# Patient Record
Sex: Female | Born: 1990 | Race: White | Hispanic: No | Marital: Married | Smoking: Former smoker
Health system: Southern US, Community
[De-identification: ages and names within clinical notes are randomized; demographics above are authoritative.]

## PROBLEM LIST (undated history)

## (undated) DIAGNOSIS — J45909 Unspecified asthma, uncomplicated: Secondary | ICD-10-CM

## (undated) DIAGNOSIS — F419 Anxiety disorder, unspecified: Secondary | ICD-10-CM

## (undated) HISTORY — DX: Unspecified asthma, uncomplicated: J45.909

## (undated) HISTORY — DX: Anxiety disorder, unspecified: F41.9

## (undated) HISTORY — PX: WISDOM TOOTH EXTRACTION: SHX21

---
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
## 2012-02-11 ENCOUNTER — Emergency Department (HOSPITAL_COMMUNITY)
Admission: EM | Admit: 2012-02-11 | Discharge: 2012-02-11 | Disposition: A | Discharge status: Home / Self Care | Payer: Self-pay | Attending: Emergency Medicine | Admitting: Emergency Medicine

## 2012-02-11 ENCOUNTER — Encounter (HOSPITAL_COMMUNITY): Payer: Self-pay | Admitting: *Deleted

## 2012-02-11 DIAGNOSIS — J9801 Acute bronchospasm: Secondary | ICD-10-CM

## 2012-02-11 DIAGNOSIS — F17200 Nicotine dependence, unspecified, uncomplicated: Secondary | ICD-10-CM | POA: Insufficient documentation

## 2012-02-11 DIAGNOSIS — Z79899 Other long term (current) drug therapy: Secondary | ICD-10-CM | POA: Insufficient documentation

## 2012-02-11 DIAGNOSIS — R059 Cough, unspecified: Secondary | ICD-10-CM | POA: Insufficient documentation

## 2012-02-11 DIAGNOSIS — J45901 Unspecified asthma with (acute) exacerbation: Secondary | ICD-10-CM | POA: Insufficient documentation

## 2012-02-11 DIAGNOSIS — R05 Cough: Secondary | ICD-10-CM | POA: Insufficient documentation

## 2012-02-11 MED ORDER — IPRATROPIUM BROMIDE 0.02 % IN SOLN
0.5000 mg | Freq: Once | RESPIRATORY_TRACT | Status: AC
  Administered 2012-02-11: 0.5 mg via RESPIRATORY_TRACT
  Filled 2012-02-11: qty 2.5

## 2012-02-11 MED ORDER — ALBUTEROL SULFATE HFA 108 (90 BASE) MCG/ACT IN AERS
2.0000 | INHALATION_SPRAY | RESPIRATORY_TRACT | Status: DC | PRN
  Filled 2012-02-11: qty 6.7

## 2012-02-11 MED ORDER — ALBUTEROL SULFATE (5 MG/ML) 0.5% IN NEBU
5.0000 mg | INHALATION_SOLUTION | Freq: Once | RESPIRATORY_TRACT | Status: AC
  Administered 2012-02-11: 5 mg via RESPIRATORY_TRACT
  Filled 2012-02-11: qty 1

## 2012-02-11 MED ORDER — ALBUTEROL SULFATE (2.5 MG/3ML) 0.083% IN NEBU: 2.5000 mg | INHALATION_SOLUTION | RESPIRATORY_TRACT | Status: DC | PRN

## 2012-02-11 NOTE — ED Notes (Signed)
Pt states she woke this morning coughing & wheezing. Pt states tried to use what little nebulizer medication she had w/ no relief. Pt does not have emergency inhaler, can not afford.

## 2012-02-11 NOTE — ED Notes (Signed)
Pt reports she woke up coughing and wheezing.  Reports cough non-productive.  States that she did have a small amount of nebulizer fluid that she used with no relief.

## 2012-02-11 NOTE — ED Provider Notes (Signed)
History   This chart was scribed for Brenda Lennert, MD, by Frederik Pear, ER scribe. The patient was seen in room APA04/[facility]04 and the patient's care was started at 0718.    CSN: [id number]  Arrival date & time 02/11/12  4034   First MD Initiated Contact with Patient 02/11/12 [phone]      Chief Complaint  Patient presents with  . Shortness of Breath    (Consider location/radiation/quality/duration/timing/severity/associated sxs/prior treatment) Patient is a 22 y.o. female presenting with shortness of breath. The history is provided by the patient. No language interpreter was used.  Shortness of Breath  The current episode started today. The problem occurs continuously. The problem has been unchanged. The problem is moderate. Nothing relieves the symptoms. The symptoms are aggravated by activity. Associated symptoms include shortness of breath. Pertinent negatives include no chest pain and no cough.   Brenda Bauer is a 22 y.o. female with a h/o of asthma that is aggravated by cold weather and exertional activities who presents to the Emergency Department complaining of constant, moderate, sudden onset SOB with associated non-productive coughing and wheezing that began this morning. She denies associated fever or chills. She reports that she had 1 nebulizer vial at home that she used without relief. She reports that she does not currently have a job so she is unable to afford refills of either the inhalers or the nebulizer medications.  Past Medical History  Diagnosis Date  . Asthma     History reviewed. No pertinent past surgical history.  History reviewed. No pertinent family history.  History  Substance Use Topics  . Smoking status: Current Every Day Smoker  . Smokeless tobacco: Not on file  . Alcohol Use: No    OB History    Grav Para Term Preterm Abortions TAB SAB Ect Mult Living                  Review of Systems  Constitutional: Negative for fatigue.  HENT:  Negative for congestion, sinus pressure and ear discharge.   Eyes: Negative for discharge.  Respiratory: Positive for shortness of breath. Negative for cough.   Cardiovascular: Negative for chest pain.  Gastrointestinal: Negative for abdominal pain and diarrhea.  Genitourinary: Negative for frequency and hematuria.  Musculoskeletal: Negative for back pain.  Skin: Negative for rash.  Neurological: Negative for seizures and headaches.  Hematological: Negative.   Psychiatric/Behavioral: Negative for hallucinations.    Allergies  Review of patient's allergies indicates no known allergies.  Home Medications   Current Outpatient Rx  Name  Route  Sig  Dispense  Refill  . ALBUTEROL SULFATE HFA 108 (90 BASE) MCG/ACT IN AERS   Inhalation   Inhale 2 puffs into the lungs every 2 (two) hours as needed for wheezing or shortness of breath (cough).   1 Inhaler   12   . ALBUTEROL SULFATE (5 MG/ML) 0.5% IN NEBU   Nebulization   Take 5 mg by nebulization every 6 (six) hours as needed for wheezing.   20 mL   0   . TUSSIN CF PO   Oral   Take 10 mLs by mouth every 4 (four) hours as needed. FOR COUGH         . PREDNISONE 50 MG PO TABS   Oral   Take 1 tablet (50 mg total) by mouth daily.   7 tablet   1     BP 98/55  Pulse 97  Temp 98.1 F (36.7 C) (Oral)  Resp  20  Ht 5\' 7"  (1.702 m)  Wt 230 lb (104.327 kg)  BMI 36.02 kg/m2  SpO2 96%  Physical Exam  Constitutional: She is oriented to person, place, and time. She appears well-developed.  HENT:  Head: Normocephalic and atraumatic.  Eyes: Conjunctivae normal and EOM are normal. No scleral icterus.  Neck: Neck supple. No thyromegaly present.  Cardiovascular: Normal rate and regular rhythm.  Exam reveals no gallop and no friction rub.   No murmur heard. Pulmonary/Chest: No stridor. She has no wheezes. She has no rales. She exhibits no tenderness.  Abdominal: She exhibits no distension. There is no tenderness. There is no rebound.    Musculoskeletal: Normal range of motion. She exhibits no edema.  Lymphadenopathy:    She has no cervical adenopathy.  Neurological: She is oriented to person, place, and time. Coordination normal.  Skin: No rash noted. No erythema.  Psychiatric: She has a normal mood and affect. Her behavior is normal.    ED Course  Procedures (including critical care time)  DIAGNOSTIC STUDIES: Oxygen Saturation is 96% on room air, adequate by my interpretation.    COORDINATION OF CARE:  07:24- Discussed planned course of treatment with the patient, including discharge, who is agreeable at this time.   Labs Reviewed - No data to display No results found.   No diagnosis found.    MDM   The chart was scribed for me under my direct supervision.  I personally performed the history, physical, and medical decision making and all procedures in the evaluation of this patient.Brenda Lennert, MD 02/11/12 [phone]

## 2012-02-11 NOTE — Discharge Instructions (Signed)
Follow up as needed

## 2012-07-07 ENCOUNTER — Encounter (HOSPITAL_COMMUNITY): Payer: Self-pay | Admitting: *Deleted

## 2012-07-07 ENCOUNTER — Emergency Department (HOSPITAL_COMMUNITY)
Admission: EM | Admit: 2012-07-07 | Discharge: 2012-07-07 | Disposition: A | Discharge status: Home / Self Care | Payer: Self-pay | Attending: Emergency Medicine | Admitting: Emergency Medicine

## 2012-07-07 DIAGNOSIS — R509 Fever, unspecified: Secondary | ICD-10-CM | POA: Insufficient documentation

## 2012-07-07 DIAGNOSIS — J029 Acute pharyngitis, unspecified: Secondary | ICD-10-CM | POA: Insufficient documentation

## 2012-07-07 DIAGNOSIS — J069 Acute upper respiratory infection, unspecified: Secondary | ICD-10-CM | POA: Insufficient documentation

## 2012-07-07 DIAGNOSIS — R1110 Vomiting, unspecified: Secondary | ICD-10-CM | POA: Insufficient documentation

## 2012-07-07 DIAGNOSIS — R059 Cough, unspecified: Secondary | ICD-10-CM | POA: Insufficient documentation

## 2012-07-07 DIAGNOSIS — Z79899 Other long term (current) drug therapy: Secondary | ICD-10-CM | POA: Insufficient documentation

## 2012-07-07 DIAGNOSIS — R0789 Other chest pain: Secondary | ICD-10-CM | POA: Insufficient documentation

## 2012-07-07 DIAGNOSIS — J45901 Unspecified asthma with (acute) exacerbation: Secondary | ICD-10-CM | POA: Insufficient documentation

## 2012-07-07 DIAGNOSIS — Z87891 Personal history of nicotine dependence: Secondary | ICD-10-CM | POA: Insufficient documentation

## 2012-07-07 DIAGNOSIS — J3489 Other specified disorders of nose and nasal sinuses: Secondary | ICD-10-CM | POA: Insufficient documentation

## 2012-07-07 DIAGNOSIS — R05 Cough: Secondary | ICD-10-CM | POA: Insufficient documentation

## 2012-07-07 DIAGNOSIS — J4521 Mild intermittent asthma with (acute) exacerbation: Secondary | ICD-10-CM

## 2012-07-07 LAB — RAPID STREP SCREEN (MED CTR MEBANE ONLY): Streptococcus, Group A Screen (Direct): NEGATIVE

## 2012-07-07 MED ORDER — PREDNISONE 10 MG PO TABS: ORAL_TABLET | ORAL | Status: DC

## 2012-07-07 MED ORDER — ALBUTEROL SULFATE 0.63 MG/3ML IN NEBU: 1.0000 | INHALATION_SOLUTION | Freq: Four times a day (QID) | RESPIRATORY_TRACT | Status: DC | PRN

## 2012-07-07 MED ORDER — AZITHROMYCIN 250 MG PO TABS: ORAL_TABLET | ORAL | Status: DC

## 2012-07-07 MED ORDER — ACETAMINOPHEN 500 MG PO TABS
1000.0000 mg | ORAL_TABLET | Freq: Once | ORAL | Status: AC
  Administered 2012-07-07: 1000 mg via ORAL
  Filled 2012-07-07: qty 2

## 2012-07-07 NOTE — ED Notes (Signed)
C/o sore throat, dry cough and nasal congestion drainage x 5 days

## 2012-07-07 NOTE — ED Provider Notes (Signed)
History     CSN: [id number]  Arrival date & time 07/07/12  1757   First MD Initiated Contact with Patient 07/07/12 1838      Chief Complaint  Patient presents with  . Sore Throat  . Fever  . Cough    (Consider location/radiation/quality/duration/timing/severity/associated sxs/prior treatment) Patient is a 22 y.o. female presenting with cough. The history is provided by the patient.  Cough Cough characteristics:  Productive Sputum characteristics:  Nondescript Severity:  Moderate Onset quality:  Gradual Duration:  5 days Timing:  Intermittent Progression:  Unchanged Chronicity:  New Smoker: no   Context: upper respiratory infection   Relieved by:  Nothing Worsened by:  Nothing tried Ineffective treatments:  Beta-agonist inhaler Associated symptoms: fever, rhinorrhea, shortness of breath, sore throat and wheezing   Associated symptoms: no chest pain, no chills, no ear pain, no headaches, no myalgias and no rash   Associated symptoms comment:  Chest tightness and occasional post-tussive emesis Sore throat:    Severity:  Mild   Onset quality:  Gradual   Duration:  5 days   Timing:  Constant   Progression:  Unchanged Risk factors comment:  Hx of asthma   Past Medical History  Diagnosis Date  . Asthma     History reviewed. No pertinent past surgical history.  No family history on file.  History  Substance Use Topics  . Smoking status: Former Smoker    Types: Cigarettes  . Smokeless tobacco: Not on file  . Alcohol Use: No    OB History   Grav Para Term Preterm Abortions TAB SAB Ect Mult Living                  Review of Systems  Constitutional: Positive for fever. Negative for chills, activity change and appetite change.  HENT: Positive for congestion, sore throat and rhinorrhea. Negative for ear pain, facial swelling, trouble swallowing, neck pain, neck stiffness and voice change.   Eyes: Negative for visual disturbance.  Respiratory: Positive for  cough, chest tightness, shortness of breath and wheezing. Negative for stridor.   Cardiovascular: Negative for chest pain.  Gastrointestinal: Negative for nausea, vomiting and abdominal pain.  Genitourinary: Negative for dysuria, flank pain and pelvic pain.  Musculoskeletal: Negative for myalgias.  Skin: Negative.  Negative for rash.  Neurological: Negative for dizziness, weakness, numbness and headaches.  Hematological: Negative for adenopathy.  Psychiatric/Behavioral: Negative for confusion.  All other systems reviewed and are negative.    Allergies  Review of patient's allergies indicates no known allergies.  Home Medications   Current Outpatient Rx  Name  Route  Sig  Dispense  Refill  . albuterol (PROVENTIL HFA;VENTOLIN HFA) 108 (90 BASE) MCG/ACT inhaler   Inhalation   Inhale 2 puffs into the lungs every 2 (two) hours as needed for wheezing or shortness of breath (cough).   1 Inhaler   12   . albuterol (PROVENTIL) (2.5 MG/3ML) 0.083% nebulizer solution   Nebulization   Take 3 mLs (2.5 mg total) by nebulization every 4 (four) hours as needed for wheezing.   30 vial   0   . EXPIRED: albuterol (PROVENTIL) (5 MG/ML) 0.5% nebulizer solution   Nebulization   Take 5 mg by nebulization every 6 (six) hours as needed for wheezing.   20 mL   0   . Phenylephrine-DM-GG (TUSSIN CF PO)   Oral   Take 10 mLs by mouth every 4 (four) hours as needed. FOR COUGH         .  predniSONE (DELTASONE) 50 MG tablet   Oral   Take 1 tablet (50 mg total) by mouth daily.   7 tablet   1     BP 138/77  Pulse 99  Temp(Src) 100 F (37.8 C) (Oral)  Resp 20  Ht 5\' 7"  (1.702 m)  Wt 234 lb (106.142 kg)  BMI 36.64 kg/m2  SpO2 99%  Physical Exam  Nursing note and vitals reviewed. Constitutional: She is oriented to person, place, and time. She appears well-developed and well-nourished. No distress.  HENT:  Head: Normocephalic and atraumatic. No trismus in the jaw.  Right Ear: Tympanic  membrane and ear canal normal.  Left Ear: Tympanic membrane and ear canal normal.  Nose: Mucosal edema present.  Mouth/Throat: Uvula is midline and mucous membranes are normal. Oral lesions present. No edematous. Posterior oropharyngeal erythema present. No oropharyngeal exudate or tonsillar abscesses.  Eyes: EOM are normal. Pupils are equal, round, and reactive to light.  Neck: Normal range of motion. Neck supple.  Cardiovascular: Normal rate, regular rhythm, normal heart sounds and intact distal pulses.   No murmur heard. Pulmonary/Chest: Effort normal. No respiratory distress. She has wheezes. She has no rales. She exhibits no tenderness.  Coarse lungs sounds bilaterally that improve after coughing.  Few scattered expiratory wheezes.  No rales   Abdominal: Soft. She exhibits no distension. There is no tenderness. There is no rebound and no guarding.  Musculoskeletal: She exhibits no edema.  Lymphadenopathy:    She has no cervical adenopathy.  Neurological: She is alert and oriented to person, place, and time. She exhibits normal muscle tone. Coordination normal.  Skin: Skin is warm and dry.    ED Course  Procedures (including critical care time)  Labs Reviewed  RAPID STREP SCREEN   Results for orders placed during the hospital encounter of 07/07/12  RAPID STREP SCREEN      Result Value Range   Streptococcus, Group A Screen (Direct) NEGATIVE  NEGATIVE        MDM       1920  Rapid strep screen still pending, lab was consulted and specimen had not been started yet.  Told that results would be another 30 minutes.     1955  Patient is feeling better after tylenol.  Fever improved.  PERC negative, pt is non-toxic appearing.  No rales or respiratory distress noted on exam.  Pt has been using inhaler but requests refill for her albuterol vials for her nebulizer.  Will also treat with steroids and z-pack.  She agrees to tylenol/ ibuprofen for fever, rest, fluids and return here if  not improving.  She appears stable for discharge.    Ketrick Matney L. Thaison Kolodziejski, PA-C 07/08/12 1240

## 2012-07-07 NOTE — ED Notes (Signed)
Instructions, prescriptions and f/u information given/reviewed - verbalizes understanding.  

## 2012-07-10 LAB — CULTURE, GROUP A STREP

## 2012-07-10 NOTE — ED Provider Notes (Signed)
Medical screening examination/treatment/procedure(s) were performed by non-physician practitioner and as supervising physician I was immediately available for consultation/collaboration.    Vida Roller, MD 07/10/12 [phone]

## 2012-08-12 ENCOUNTER — Emergency Department (HOSPITAL_COMMUNITY)
Admission: EM | Admit: 2012-08-12 | Discharge: 2012-08-12 | Discharge status: Against Medical Advice | Payer: Self-pay | Attending: Emergency Medicine | Admitting: Emergency Medicine

## 2012-08-12 ENCOUNTER — Encounter (HOSPITAL_COMMUNITY): Payer: Self-pay | Admitting: *Deleted

## 2012-08-12 DIAGNOSIS — Z3202 Encounter for pregnancy test, result negative: Secondary | ICD-10-CM | POA: Insufficient documentation

## 2012-08-12 DIAGNOSIS — J45909 Unspecified asthma, uncomplicated: Secondary | ICD-10-CM | POA: Insufficient documentation

## 2012-08-12 DIAGNOSIS — R109 Unspecified abdominal pain: Secondary | ICD-10-CM | POA: Insufficient documentation

## 2012-08-12 DIAGNOSIS — Z87891 Personal history of nicotine dependence: Secondary | ICD-10-CM | POA: Insufficient documentation

## 2012-08-12 LAB — URINE MICROSCOPIC-ADD ON

## 2012-08-12 LAB — URINALYSIS, ROUTINE W REFLEX MICROSCOPIC
Bilirubin Urine: NEGATIVE
Glucose, UA: 100 mg/dL — AB
Ketones, ur: NEGATIVE mg/dL
Leukocytes, UA: NEGATIVE
Nitrite: NEGATIVE
Protein, ur: NEGATIVE mg/dL
Specific Gravity, Urine: >1.03 — ABNORMAL HIGH (ref 1.005–1.030)
Urobilinogen, UA: 0.2 mg/dL (ref 0.0–1.0)
pH: 5.5 (ref 5.0–8.0)

## 2012-08-12 LAB — PREGNANCY, URINE: Preg Test, Ur: NEGATIVE

## 2012-08-12 NOTE — ED Notes (Signed)
Pt c/o lower abdominal pain. Pt states  "it feels like menstrual cramps." Pt states she had her period 2 weeks ago.

## 2012-12-25 ENCOUNTER — Emergency Department (HOSPITAL_COMMUNITY)
Admission: EM | Admit: 2012-12-25 | Discharge: 2012-12-25 | Disposition: A | Discharge status: Home / Self Care | Payer: Medicaid Other | Attending: Emergency Medicine | Admitting: Emergency Medicine

## 2012-12-25 ENCOUNTER — Encounter (HOSPITAL_COMMUNITY): Payer: Self-pay | Admitting: Emergency Medicine

## 2012-12-25 DIAGNOSIS — J45901 Unspecified asthma with (acute) exacerbation: Secondary | ICD-10-CM | POA: Insufficient documentation

## 2012-12-25 DIAGNOSIS — Z87891 Personal history of nicotine dependence: Secondary | ICD-10-CM | POA: Insufficient documentation

## 2012-12-25 DIAGNOSIS — R000 Tachycardia, unspecified: Secondary | ICD-10-CM | POA: Insufficient documentation

## 2012-12-25 MED ORDER — IPRATROPIUM BROMIDE 0.02 % IN SOLN
0.5000 mg | Freq: Once | RESPIRATORY_TRACT | Status: AC
  Administered 2012-12-25: 0.5 mg via RESPIRATORY_TRACT
  Filled 2012-12-25: qty 2.5

## 2012-12-25 MED ORDER — ALBUTEROL SULFATE (5 MG/ML) 0.5% IN NEBU
5.0000 mg | INHALATION_SOLUTION | Freq: Once | RESPIRATORY_TRACT | Status: AC
  Administered 2012-12-25: 5 mg via RESPIRATORY_TRACT
  Filled 2012-12-25: qty 1

## 2012-12-25 MED ORDER — PREDNISONE 20 MG PO TABS: 60.0000 mg | ORAL_TABLET | Freq: Every day | ORAL | Status: DC

## 2012-12-25 MED ORDER — PREDNISONE 50 MG PO TABS
60.0000 mg | ORAL_TABLET | Freq: Once | ORAL | Status: AC
  Administered 2012-12-25: 60 mg via ORAL
  Filled 2012-12-25 (×2): qty 1

## 2012-12-25 NOTE — ED Provider Notes (Signed)
CSN: [id number]     Arrival date & time 12/25/12  1535 History   First MD Initiated Contact with Patient 12/25/12 1608     Chief Complaint  Patient presents with  . Asthma   (Consider location/radiation/quality/duration/timing/severity/associated sxs/prior Treatment) Patient is a 22 y.o. female presenting with asthma. The history is provided by the patient.  Asthma This is a recurrent (Patient describes yearly increased flares of her Orson Eva which is triggered by cold weather.) problem. The current episode started yesterday. The problem occurs intermittently. The problem has been unchanged. Associated symptoms include chest pain and coughing. Pertinent negatives include no abdominal pain, arthralgias, chills, congestion, diaphoresis, fatigue, fever, headaches, joint swelling, myalgias, nausea, neck pain, numbness, rash, sore throat, swollen glands or weakness. The symptoms are aggravated by exertion. Treatments tried: albuterol mdi - taking q 2 hours without relief. The treatment provided no relief.    Past Medical History  Diagnosis Date  . Asthma    History reviewed. No pertinent past surgical history. No family history on file. History  Substance Use Topics  . Smoking status: Former Smoker    Types: Cigarettes  . Smokeless tobacco: Not on file  . Alcohol Use: No   OB History   Grav Para Term Preterm Abortions TAB SAB Ect Mult Living                 Review of Systems  Constitutional: Negative for fever, chills, diaphoresis and fatigue.  HENT: Negative for congestion and sore throat.   Eyes: Negative.   Respiratory: Positive for cough, shortness of breath and wheezing. Negative for chest tightness and stridor.   Cardiovascular: Positive for chest pain.       She reports precordial soreness from coughing.  Gastrointestinal: Negative for nausea and abdominal pain.  Genitourinary: Negative.   Musculoskeletal: Negative for arthralgias, joint swelling, myalgias and neck pain.   Skin: Negative.  Negative for rash and wound.  Neurological: Negative for dizziness, weakness, light-headedness, numbness and headaches.  Psychiatric/Behavioral: Negative.     Allergies  Review of patient's allergies indicates no known allergies.  Home Medications   Current Outpatient Rx  Name  Route  Sig  Dispense  Refill  . predniSONE (DELTASONE) 20 MG tablet   Oral   Take 3 tablets (60 mg total) by mouth daily.   12 tablet   0    BP 126/84  Pulse 118  Temp(Src) 98.1 F (36.7 C) (Oral)  Resp 18  Ht 5\' 7"  (1.702 m)  Wt 220 lb (99.791 kg)  BMI 34.45 kg/m2  SpO2 96%  LMP 11/30/2012 Physical Exam  Nursing note and vitals reviewed. Constitutional: She appears well-developed and well-nourished.  HENT:  Head: Normocephalic and atraumatic.  Eyes: Conjunctivae are normal.  Neck: Normal range of motion.  Cardiovascular: Regular rhythm, normal heart sounds and intact distal pulses.  Tachycardia present.   Pulmonary/Chest: Effort normal. She has wheezes in the right lower field and the left lower field.  Examined post albuterol and atrovent neb tx.  Trace expiratory wheeze bilateral bases.  Abdominal: Soft. Bowel sounds are normal. There is no tenderness.  Musculoskeletal: Normal range of motion.  Neurological: She is alert.  Skin: Skin is warm and dry.  Psychiatric: She has a normal mood and affect.    ED Course  Procedures (including critical care time) Labs Review Labs Reviewed - No data to display Imaging Review No results found.  EKG Interpretation   None       MDM   1.  Asthma exacerbation    Pt felt much improved after albuterol and atrovent neb tx.  Prednisone pulse dose 60 mg given prior to discharge.  She was also provided a spacer for her mdi which she does not have.  She is scheduled f/u with pcp next,  Encouraged recheck sooner here or by pcp for any worsened sx.  Pt with dry cough, no fevers, no exam findings suggesting pulmonary infection.  She  was tachycardic today but had just used her mdi prior to arrival here.    Burgess Amor, PA-C 12/25/12 1640

## 2012-12-25 NOTE — ED Notes (Signed)
Pt states cough began last night. Hx of asthma. States inhaler is not helping. NAD.

## 2013-01-02 NOTE — ED Provider Notes (Signed)
Medical screening examination/treatment/procedure(s) were performed by non-physician practitioner and as supervising physician I was immediately available for consultation/collaboration.  EKG Interpretation   None         Roney Marion, MD 01/02/13 2312

## 2013-02-28 IMAGING — CR DG CHEST 2V
2 series · 2 of 2 positions shown · non-contrast
Comparison: 03/30/2011; 03/01/2010

CLINICAL DATA: Asthma

CHEST - 2 VIEW

[view not recorded (1 of 2)]
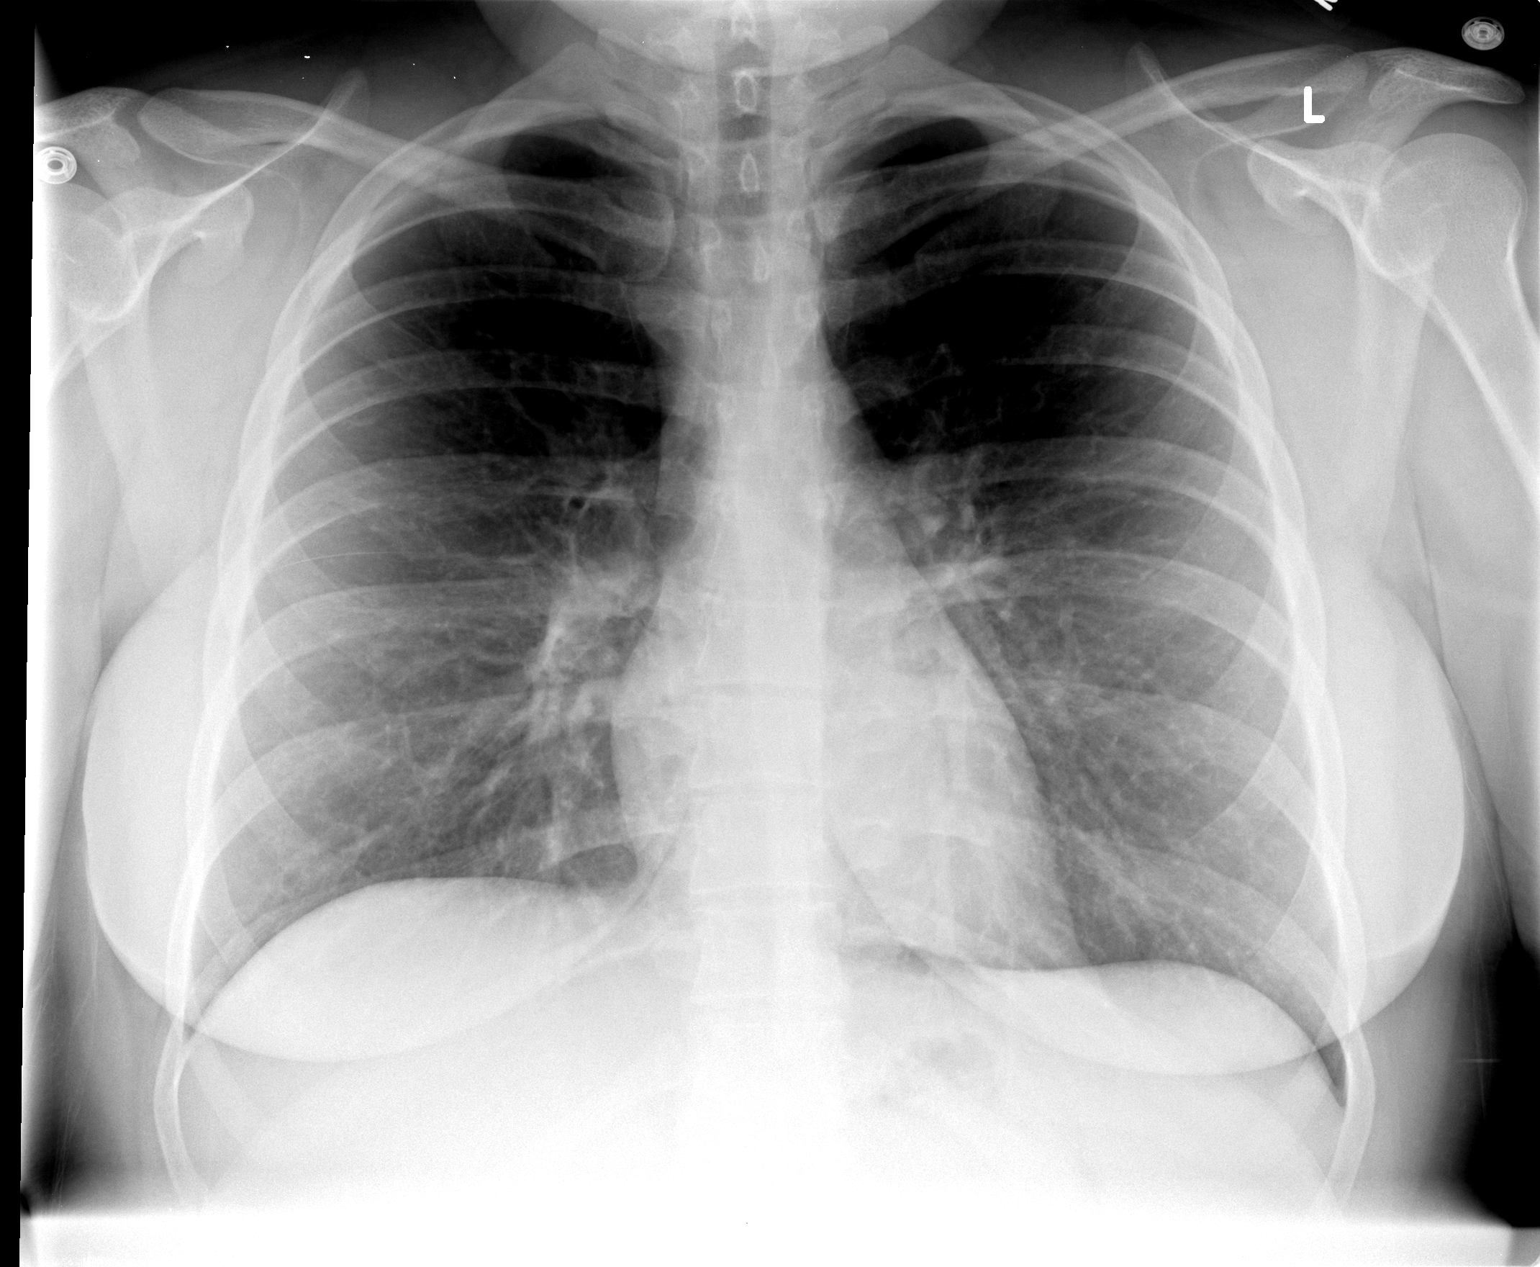

[view not recorded (2 of 2)]
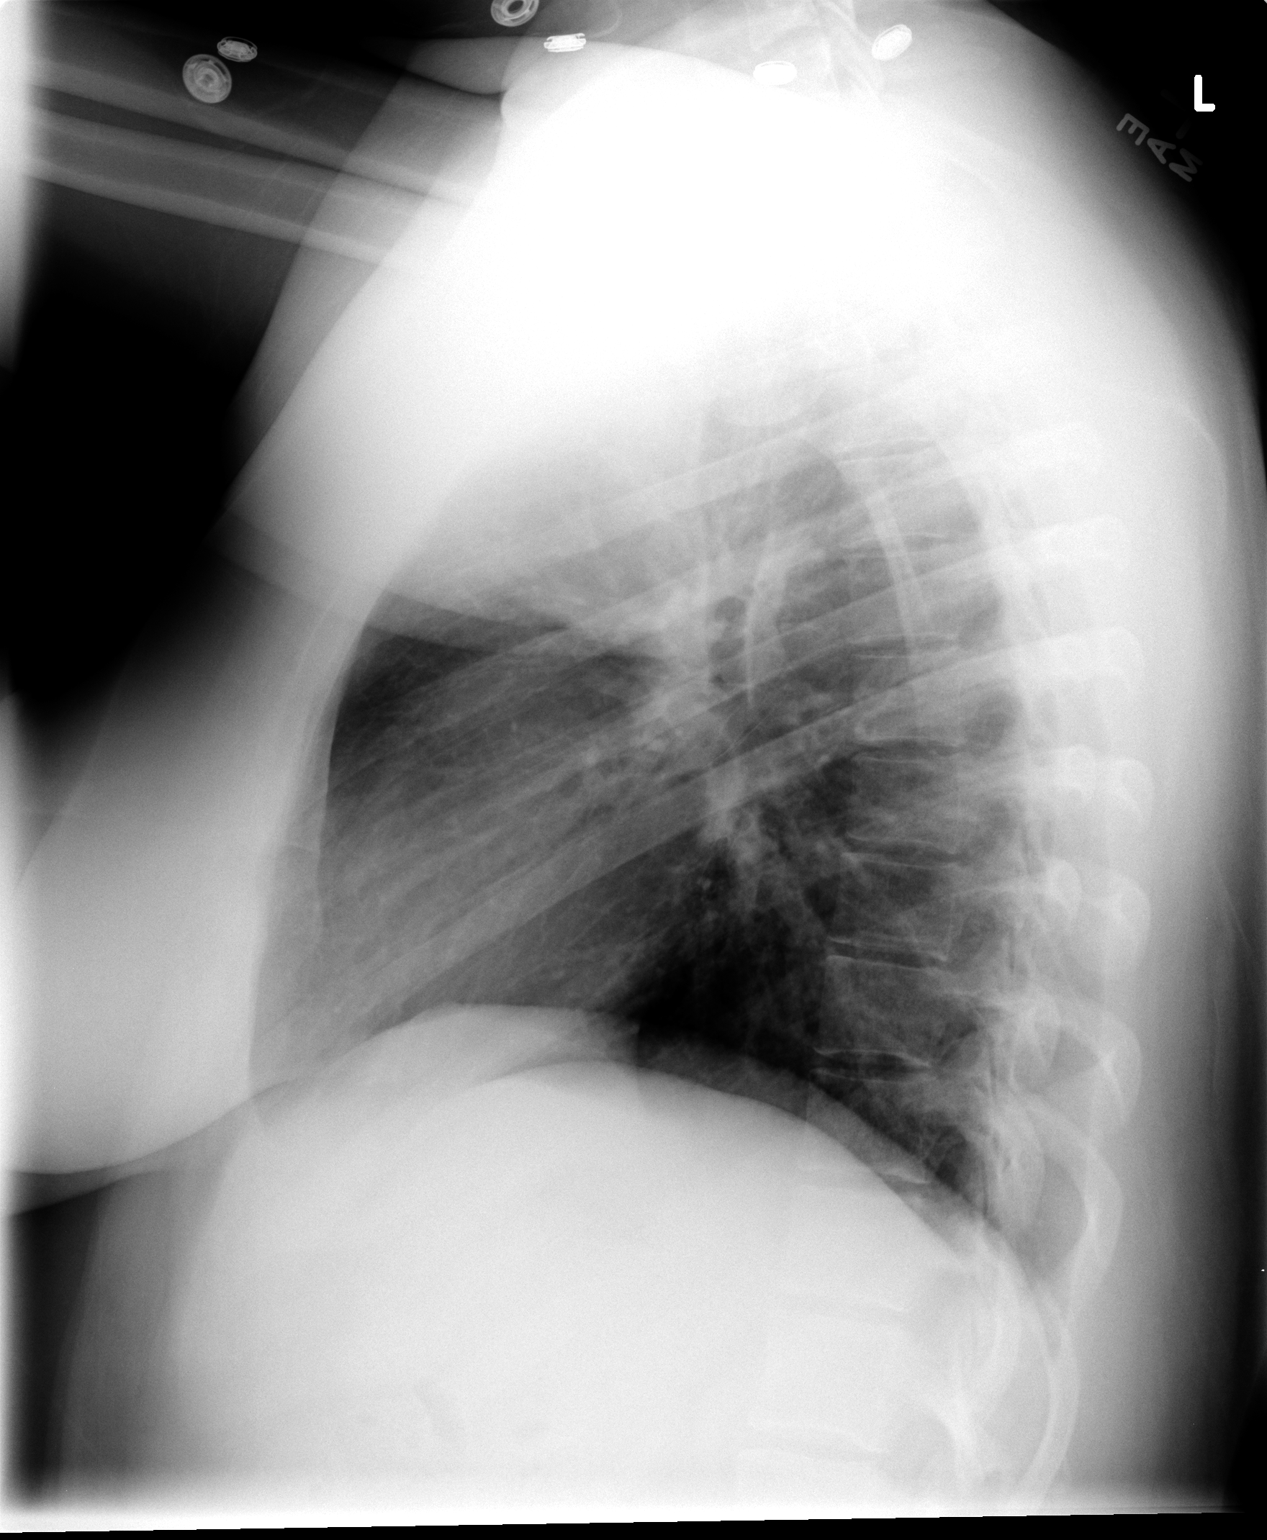

[2 of 2 positions shown; findings below may reference images not displayed]

FINDINGS: Normal cardiac silhouette and mediastinal contours.  No
focal parenchymal opacity.  No pleural effusion or pneumothorax.
Unchanged bones.
IMPRESSION: No acute cardiopulmonary disease.  Specifically, no evidence of
pneumonia.

## 2013-05-29 ENCOUNTER — Emergency Department (HOSPITAL_COMMUNITY): Payer: Self-pay

## 2013-05-29 ENCOUNTER — Encounter (HOSPITAL_COMMUNITY): Payer: Self-pay | Admitting: Emergency Medicine

## 2013-05-29 ENCOUNTER — Emergency Department (HOSPITAL_COMMUNITY)
Admission: EM | Admit: 2013-05-29 | Discharge: 2013-05-29 | Disposition: A | Discharge status: Home / Self Care | Payer: Medicaid Other | Attending: Emergency Medicine | Admitting: Emergency Medicine

## 2013-05-29 DIAGNOSIS — M542 Cervicalgia: Secondary | ICD-10-CM | POA: Insufficient documentation

## 2013-05-29 DIAGNOSIS — R51 Headache: Secondary | ICD-10-CM | POA: Insufficient documentation

## 2013-05-29 DIAGNOSIS — Z5320 Procedure and treatment not carried out because of patient's decision for unspecified reasons: Secondary | ICD-10-CM

## 2013-05-29 DIAGNOSIS — J45909 Unspecified asthma, uncomplicated: Secondary | ICD-10-CM | POA: Insufficient documentation

## 2013-05-29 DIAGNOSIS — R519 Headache, unspecified: Secondary | ICD-10-CM

## 2013-05-29 DIAGNOSIS — Z87891 Personal history of nicotine dependence: Secondary | ICD-10-CM | POA: Insufficient documentation

## 2013-05-29 DIAGNOSIS — IMO0002 Reserved for concepts with insufficient information to code with codable children: Secondary | ICD-10-CM | POA: Insufficient documentation

## 2013-05-29 DIAGNOSIS — Z5329 Procedure and treatment not carried out because of patient's decision for other reasons: Secondary | ICD-10-CM

## 2013-05-29 NOTE — ED Provider Notes (Signed)
CSN: [id number]     Arrival date & time 05/29/13  1811 History   First MD Initiated Contact with Patient 05/29/13 2003     Chief Complaint  Patient presents with  . Neck Pain     (Consider location/radiation/quality/duration/timing/severity/associated sxs/prior Treatment) Patient is a 23 y.o. female presenting with neck pain. The history is provided by the patient.  Neck Pain Pain location:  L side and R side Quality:  Unable to specify Radiates to: head. Pain severity:  Moderate Pain is:  Same all the time Onset quality:  Gradual Duration:  1 day Timing:  Constant Progression:  Unchanged Chronicity:  New Context: not fall, not pedestrian accident and not recent injury   Relieved by:  Nothing Ineffective treatments:  NSAIDs Associated symptoms: no bladder incontinence, no bowel incontinence, no chest pain, no numbness, no photophobia, no syncope, no tingling and no weakness   Risk factors: no recent epidural, no recent head injury and no recurrent falls     Past Medical History  Diagnosis Date  . Asthma    History reviewed. No pertinent past surgical history. History reviewed. No pertinent family history. History  Substance Use Topics  . Smoking status: Former Smoker    Types: Cigarettes  . Smokeless tobacco: Not on file  . Alcohol Use: No   OB History   Grav Para Term Preterm Abortions TAB SAB Ect Mult Living                 Review of Systems  Constitutional: Negative for activity change.       All ROS Neg except as noted in HPI  HENT: Negative for nosebleeds.   Eyes: Negative for photophobia and discharge.  Respiratory: Negative for cough, shortness of breath and wheezing.   Cardiovascular: Negative for chest pain, palpitations and syncope.  Gastrointestinal: Negative for abdominal pain, blood in stool and bowel incontinence.  Genitourinary: Negative for bladder incontinence, dysuria, frequency and hematuria.  Musculoskeletal: Positive for neck pain. Negative  for arthralgias and back pain.  Skin: Negative.   Neurological: Negative for dizziness, tingling, seizures, speech difficulty, weakness and numbness.  Psychiatric/Behavioral: Negative for hallucinations and confusion.      Allergies  Review of patient's allergies indicates no known allergies.  Home Medications   Prior to Admission medications   Medication Sig Start Date End Date Taking? Authorizing Provider  predniSONE (DELTASONE) 20 MG tablet Take 3 tablets (60 mg total) by mouth daily. 12/25/12   Burgess AmorJulie Idol, PA-C   BP 144/68  Pulse 95  Temp(Src) 98.4 F (36.9 C) (Oral)  Resp 24  Ht 5\' 7"  (1.702 m)  Wt 250 lb (113.399 kg)  BMI 39.15 kg/m2  SpO2 99%  LMP 05/15/2013 Physical Exam  Nursing note and vitals reviewed. Constitutional: She is oriented to person, place, and time. She appears well-developed and well-nourished.  Non-toxic appearance.  HENT:  Head: Normocephalic.  Right Ear: Tympanic membrane and external ear normal.  Left Ear: Tympanic membrane and external ear normal.  Eyes: EOM and lids are normal. Pupils are equal, round, and reactive to light.  Neck: Normal range of motion. Neck supple. Carotid bruit is not present.  Pain to palpation of the posterior neck. No hot areas.   Cardiovascular: Normal rate, regular rhythm, normal heart sounds, intact distal pulses and normal pulses.   Pulmonary/Chest: Breath sounds normal. No respiratory distress.  Abdominal: Soft. Bowel sounds are normal. There is no tenderness. There is no guarding.  Musculoskeletal: Normal range of motion.  No  palpable step off of the cervical spine.  Lymphadenopathy:       Head (right side): No submandibular adenopathy present.       Head (left side): No submandibular adenopathy present.    She has no cervical adenopathy.  Neurological: She is alert and oriented to person, place, and time. She has normal strength. No cranial nerve deficit or sensory deficit. She exhibits normal muscle tone.  Coordination normal.  Skin: Skin is warm and dry.  Psychiatric: She has a normal mood and affect. Her speech is normal.    ED Course  Procedures (including critical care time) Labs Review Labs Reviewed - No data to display  Imaging Review No results found.   EKG Interpretation None      MDM  pt reports the worse headache of her life 2 days ago. The examination is negative for neurologic deficit. The vital signs are well within normal limits. I discussed with the patient that since this was the worst headache of her life that she should have a CT scan, and possible other diagnostic studies. The patient states that she does not want to have a CT done. She only wanted to know that she did not have a major problems with her head or neck. I reemphasized that the examination was fine, but that completion of the workup would include diagnostic studies. The patient refused these studies, and accepts responsibility for not having them done. The patient signs out of the emergency department AGAINST MEDICAL ADVICE.    Final diagnoses:  None    *I have reviewed nursing notes, vital signs, and all appropriate lab and imaging results for this patient.Kathie Dike*    Abhay Godbolt M Dashton Czerwinski, PA-C 05/30/13 [phone]

## 2013-05-29 NOTE — ED Notes (Signed)
Neck pain and ha, since last pm . No known injury , no fever.

## 2013-05-29 NOTE — Discharge Instructions (Signed)
Please return to the emergency department if your headache becomes worse, there are changes in your medical condition, and you change your mind about being worked up for this neck and head pain.

## 2013-05-30 NOTE — ED Provider Notes (Signed)
Medical screening examination/treatment/procedure(s) were performed by non-physician practitioner and as supervising physician I was immediately available for consultation/collaboration.   EKG Interpretation None       Treyven Lafauci, MD 05/30/13 2341 

## 2013-06-23 ENCOUNTER — Encounter (HOSPITAL_COMMUNITY): Payer: Self-pay | Admitting: Emergency Medicine

## 2013-06-23 ENCOUNTER — Emergency Department (HOSPITAL_COMMUNITY)
Admission: EM | Admit: 2013-06-23 | Discharge: 2013-06-23 | Disposition: A | Discharge status: Home / Self Care | Payer: Medicaid Other | Attending: Emergency Medicine | Admitting: Emergency Medicine

## 2013-06-23 ENCOUNTER — Emergency Department (HOSPITAL_COMMUNITY): Payer: Self-pay

## 2013-06-23 DIAGNOSIS — Z87891 Personal history of nicotine dependence: Secondary | ICD-10-CM | POA: Insufficient documentation

## 2013-06-23 DIAGNOSIS — J45901 Unspecified asthma with (acute) exacerbation: Secondary | ICD-10-CM | POA: Insufficient documentation

## 2013-06-23 DIAGNOSIS — IMO0002 Reserved for concepts with insufficient information to code with codable children: Secondary | ICD-10-CM | POA: Insufficient documentation

## 2013-06-23 DIAGNOSIS — Z79899 Other long term (current) drug therapy: Secondary | ICD-10-CM | POA: Insufficient documentation

## 2013-06-23 MED ORDER — IPRATROPIUM-ALBUTEROL 0.5-2.5 (3) MG/3ML IN SOLN
3.0000 mL | Freq: Once | RESPIRATORY_TRACT | Status: AC
  Administered 2013-06-23: 3 mL via RESPIRATORY_TRACT
  Filled 2013-06-23: qty 3

## 2013-06-23 MED ORDER — PREDNISONE 10 MG PO TABS
60.0000 mg | ORAL_TABLET | Freq: Once | ORAL | Status: AC
  Administered 2013-06-23: 60 mg via ORAL
  Filled 2013-06-23 (×2): qty 1

## 2013-06-23 MED ORDER — PREDNISONE 20 MG PO TABS: ORAL_TABLET | ORAL | Status: DC

## 2013-06-23 MED ORDER — ALBUTEROL SULFATE HFA 108 (90 BASE) MCG/ACT IN AERS
2.0000 | INHALATION_SPRAY | Freq: Once | RESPIRATORY_TRACT | Status: AC
  Administered 2013-06-23: 2 via RESPIRATORY_TRACT
  Filled 2013-06-23: qty 6.7

## 2013-06-23 NOTE — Discharge Instructions (Signed)
Asthma, Adult  Asthma is a condition of the lungs in which the airways tighten and narrow. Asthma can make it hard to breathe. Asthma cannot be cured, but medicine and lifestyle changes can help control it. Asthma may be started (triggered) by:  · Animal skin flakes (dander).  · Dust.  · Cockroaches.  · Pollen.  · Mold.  · Smoke.  · Cleaning products.  · Hair sprays or aerosol sprays.  · Paint fumes or strong smells.  · Cold air, weather changes, and winds.  · Crying or laughing hard.  · Stress.  · Certain medicines or drugs.  · Foods, such as dried fruit, potato chips, and sparkling grape juice.  · Infections or conditions (colds, flu).  · Exercise.  · Certain medical conditions or diseases.  · Exercise or tiring activities.  HOME CARE   · Take medicine as told by your doctor.  · Use a peak flow meter as told by your doctor. A peak flow meter is a tool that measures how well the lungs are working.  · Record and keep track of the peak flow meter's readings.  · Understand and use the asthma action plan. An asthma action plan is a written plan for taking care of your asthma and treating your attacks.  · To help prevent asthma attacks:  · Do not smoke. Stay away from secondhand smoke.  · Change your heating and air conditioning filter often.  · Limit your use of fireplaces and wood stoves.  · Get rid of pests (such as roaches and mice) and their droppings.  · Throw away plants if you see mold on them.  · Clean your floors. Dust regularly. Use cleaning products that do not smell.  · Have someone vacuum when you are not home. Use a vacuum cleaner with a HEPA filter if possible.  · Replace carpet with wood, tile, or vinyl flooring. Carpet can trap animal skin flakes and dust.  · Use allergy-proof pillows, mattress covers, and box spring covers.  · Wash bed sheets and blankets every week in hot water and dry them in a dryer.  · Use blankets that are made of polyester or cotton.  · Clean bathrooms and kitchens with bleach.  If possible, have someone repaint the walls in these rooms with mold-resistant paint. Keep out of the rooms that are being cleaned and painted.  · Wash hands often.  GET HELP IF:  · You have make a whistling sound when breaking (wheeze), have shortness of breath, or have a cough even if taking medicine to prevent attacks.  · The colored mucus you cough up (sputum) is thicker than usual.  · The colored mucus you cough up changes from clear or white to yellow, green, gray, or bloody.  · You have problems from the medicine you are taking such as:  · A rash.  · Itching.  · Swelling.  · Trouble breathing.  · You need reliever medicines more than 2 3 times a week.  · Your peak flow measurement is still at 50 79% of your personal best after following the action plan for 1 hour.  GET HELP RIGHT AWAY IF:   · You seem to be worse and are not responding to medicine during an asthma attack.  · You are short of breath even at rest.  · You get short of breath when doing very little activity.  · You have trouble eating, drinking, or talking.  · You have chest pain.  ·   You have a fast heartbeat.  · Your lips or fingernails start to turn blue.  · You are lightheaded, dizzy, or faint.  · Your peak flow is less than 50% of your personal best.  · You have a fever or lasting symptoms for more than 2 3 days.  · You have a fever and your symptoms suddenly get worse.  MAKE SURE YOU:   · Understand these instructions.  · Will watch your condition.  · Will get help right away if you are not doing well or get worse.  Document Released: 06/24/2007 Document Revised: 10/26/2012 Document Reviewed: 08/04/2012  [employer]® Patient Information ©2014 [employer].

## 2013-06-23 NOTE — ED Notes (Addendum)
Productive cough and wheezing since yesterday.   albuteral inhaler is empty. States she has some chest pain when she coughs.

## 2013-06-26 NOTE — ED Provider Notes (Signed)
CSN: [id number]     Arrival date & time 06/23/13  1134 History   First MD Initiated Contact with Patient 06/23/13 1202     Chief Complaint  Patient presents with  . Asthma     (Consider location/radiation/quality/duration/timing/severity/associated sxs/prior Treatment) Patient is a 23 y.o. female presenting with asthma. The history is provided by the patient.  Asthma This is a recurrent problem. Episode onset: onset of sx's one day prior to ed arrival. The problem occurs constantly. The problem has been unchanged. Associated symptoms include chest pain, congestion and coughing. Pertinent negatives include no abdominal pain, chills, fever, headaches, nausea, neck pain, numbness, rash, sore throat, swollen glands, visual change, vomiting or weakness. Associated symptoms comments: Chest tightness and shortness of breath. The symptoms are aggravated by exertion. She has tried nothing (pt reports hx of asthma and recently ran out of her inhaler.  ) for the symptoms. The treatment provided no relief.    Past Medical History  Diagnosis Date  . Asthma    History reviewed. No pertinent past surgical history. History reviewed. No pertinent family history. History  Substance Use Topics  . Smoking status: Former Smoker    Types: Cigarettes  . Smokeless tobacco: Not on file  . Alcohol Use: No   OB History   Grav Para Term Preterm Abortions TAB SAB Ect Mult Living                 Review of Systems  Constitutional: Negative for fever, chills, activity change and appetite change.  HENT: Positive for congestion. Negative for facial swelling, rhinorrhea, sore throat and trouble swallowing.   Eyes: Negative for visual disturbance.  Respiratory: Positive for cough, chest tightness, shortness of breath and wheezing. Negative for stridor.   Cardiovascular: Positive for chest pain.       Chest pain only with coughing  Gastrointestinal: Negative for nausea, vomiting and abdominal pain.   Genitourinary: Negative for dysuria and flank pain.  Musculoskeletal: Negative for neck pain and neck stiffness.  Skin: Negative.  Negative for rash.  Neurological: Negative for dizziness, weakness, numbness and headaches.  Hematological: Negative for adenopathy.  Psychiatric/Behavioral: Negative for confusion.  All other systems reviewed and are negative.     Allergies  Review of patient's allergies indicates no known allergies.  Home Medications   Prior to Admission medications   Medication Sig Start Date End Date Taking? Authorizing Provider  norgestimate-ethinyl estradiol (ORTHO-CYCLEN,SPRINTEC,PREVIFEM) 0.25-35 MG-MCG tablet Take 1 tablet by mouth daily.   Yes Historical Provider, MD  predniSONE (DELTASONE) 20 MG tablet Two tabs po qd x 5 days 06/23/13   Jaret Coppedge L. Lawerence Dery, PA-C   BP 100/85  Pulse 96  Temp(Src) 98.2 F (36.8 C) (Oral)  Resp 18  Ht 5\' 7"  (1.702 m)  Wt 240 lb (108.863 kg)  BMI 37.58 kg/m2  SpO2 96%  LMP 04/24/2013 Physical Exam  Nursing note and vitals reviewed. Constitutional: She is oriented to person, place, and time. She appears well-developed and well-nourished. No distress.  HENT:  Head: Normocephalic and atraumatic.  Right Ear: Tympanic membrane and ear canal normal.  Left Ear: Tympanic membrane and ear canal normal.  Mouth/Throat: Uvula is midline, oropharynx is clear and moist and mucous membranes are normal. No oropharyngeal exudate.  Eyes: EOM are normal. Pupils are equal, round, and reactive to light.  Neck: Normal range of motion, full passive range of motion without pain and phonation normal. Neck supple.  Cardiovascular: Normal rate, regular rhythm, normal heart sounds and intact distal  pulses.   No murmur heard. Pulmonary/Chest: Effort normal. No stridor. No respiratory distress. She has wheezes. She has no rales. She exhibits no tenderness.  Few scatter inspiratory wheezes with mildly diminished lung sounds bilaterally  Abdominal: Soft.  She exhibits no distension. There is no tenderness. There is no rebound.  Musculoskeletal: She exhibits no edema.  Lymphadenopathy:    She has no cervical adenopathy.  Neurological: She is alert and oriented to person, place, and time. She exhibits normal muscle tone. Coordination normal.  Skin: Skin is warm and dry.    ED Course  Procedures (including critical care time) Labs Review Labs Reviewed - No data to display  Imaging Review No results found.   EKG Interpretation None      MDM   Final diagnoses:  Asthma exacerbation    pt with hx of asthma and ran out of her inhaler recently.  Chest tightness and cough for one day .  No tachycardia, tachypnea or hypoxia.  No fever.  Pt is well appearing, laughing and talking with friends at bedside.  Vitals stable.  Lung sounds improved after neb.  Pt is feeling better and requesting discharge.  Given lack of fever, no rales on exam and resolution of sx's after neb tx, I cancelled the CXR.    Pt dispensed albuterol inhaler and rx for prednisone taper written.  She agrees to close f/u with PMD and advised to return here if needed.  She appears stable for d/c and agrees to plan    Lysa Livengood L. Jerald Villalona, PA-C 06/26/13 1411

## 2013-06-27 NOTE — ED Provider Notes (Signed)
Medical screening examination/treatment/procedure(s) were performed by non-physician practitioner and as supervising physician I was immediately available for consultation/collaboration.   EKG Interpretation None        Laray Anger, DO 06/27/13 1128

## 2013-10-14 ENCOUNTER — Encounter (HOSPITAL_COMMUNITY): Payer: Self-pay | Admitting: Emergency Medicine

## 2013-10-14 ENCOUNTER — Emergency Department (HOSPITAL_COMMUNITY)
Admission: EM | Admit: 2013-10-14 | Discharge: 2013-10-14 | Disposition: A | Discharge status: Home / Self Care | Payer: Medicaid Other | Attending: Emergency Medicine | Admitting: Emergency Medicine

## 2013-10-14 DIAGNOSIS — J45901 Unspecified asthma with (acute) exacerbation: Secondary | ICD-10-CM | POA: Insufficient documentation

## 2013-10-14 DIAGNOSIS — Z87891 Personal history of nicotine dependence: Secondary | ICD-10-CM | POA: Insufficient documentation

## 2013-10-14 DIAGNOSIS — J4541 Moderate persistent asthma with (acute) exacerbation: Secondary | ICD-10-CM

## 2013-10-14 DIAGNOSIS — Z79899 Other long term (current) drug therapy: Secondary | ICD-10-CM | POA: Insufficient documentation

## 2013-10-14 DIAGNOSIS — R0602 Shortness of breath: Secondary | ICD-10-CM | POA: Insufficient documentation

## 2013-10-14 MED ORDER — ALBUTEROL SULFATE HFA 108 (90 BASE) MCG/ACT IN AERS: 1.0000 | INHALATION_SPRAY | Freq: Four times a day (QID) | RESPIRATORY_TRACT | Status: AC | PRN

## 2013-10-14 MED ORDER — ALBUTEROL SULFATE HFA 108 (90 BASE) MCG/ACT IN AERS
2.0000 | INHALATION_SPRAY | Freq: Once | RESPIRATORY_TRACT | Status: AC
  Administered 2013-10-14: 2 via RESPIRATORY_TRACT
  Filled 2013-10-14: qty 6.7

## 2013-10-14 MED ORDER — IPRATROPIUM-ALBUTEROL 0.5-2.5 (3) MG/3ML IN SOLN
3.0000 mL | Freq: Once | RESPIRATORY_TRACT | Status: AC
  Administered 2013-10-14: 3 mL via RESPIRATORY_TRACT
  Filled 2013-10-14: qty 3

## 2013-10-14 MED ORDER — ALBUTEROL (5 MG/ML) CONTINUOUS INHALATION SOLN: 10.0000 mg/h | INHALATION_SOLUTION | Freq: Once | RESPIRATORY_TRACT | Status: DC

## 2013-10-14 MED ORDER — PREDNISONE 20 MG PO TABS: ORAL_TABLET | ORAL | Status: DC

## 2013-10-14 MED ORDER — PREDNISONE 50 MG PO TABS
60.0000 mg | ORAL_TABLET | Freq: Once | ORAL | Status: AC
  Administered 2013-10-14: 60 mg via ORAL
  Filled 2013-10-14 (×2): qty 1

## 2013-10-14 NOTE — ED Provider Notes (Signed)
CSN: [id number]     Arrival date & time 10/14/13  0430 History   First MD Initiated Contact with Patient 10/14/13 [phone]     Chief Complaint  Patient presents with  . Asthma     (Consider location/radiation/quality/duration/timing/severity/associated sxs/prior Treatment) HPI Comments: 23 year old female with history of asthma, past smoker presents with shortness of breath since midnight. This is similar to her multiple previous episodes of asthma. Patient is out of her inhaler. No current steroids. No blood clot history, cardiac history, recent surgery or leg swelling or leg pain.  Patient is a 23 y.o. female presenting with asthma. The history is provided by the patient.  Asthma Associated symptoms include shortness of breath. Pertinent negatives include no chest pain and no headaches.    Past Medical History  Diagnosis Date  . Asthma    History reviewed. No pertinent past surgical history. No family history on file. History  Substance Use Topics  . Smoking status: Former Smoker    Types: Cigarettes  . Smokeless tobacco: Not on file  . Alcohol Use: No   OB History   Grav Para Term Preterm Abortions TAB SAB Ect Mult Living                 Review of Systems  Constitutional: Negative for fever and chills.  Respiratory: Positive for cough and shortness of breath.   Cardiovascular: Negative for chest pain and leg swelling.  Neurological: Negative for headaches.      Allergies  Review of patient's allergies indicates no known allergies.  Home Medications   Prior to Admission medications   Medication Sig Start Date End Date Taking? Authorizing Provider  albuterol (PROVENTIL HFA;VENTOLIN HFA) 108 (90 BASE) MCG/ACT inhaler Inhale 1-2 puffs into the lungs every 6 (six) hours as needed for wheezing or shortness of breath. 10/14/13   Enid Skeens, MD  norgestimate-ethinyl estradiol (ORTHO-CYCLEN,SPRINTEC,PREVIFEM) 0.25-35 MG-MCG tablet Take 1 tablet by mouth daily.     Historical Provider, MD  predniSONE (DELTASONE) 20 MG tablet Two tabs po qd x 5 days 06/23/13   Tammy L. Triplett, PA-C  predniSONE (DELTASONE) 20 MG tablet 2 tabs po daily x 3 days 10/14/13   Enid Skeens, MD   BP 133/73  Pulse 88  Temp(Src) 97.9 F (36.6 C) (Oral)  Resp 22  Ht  (1.702 m)  Wt 245 lb (111.131 kg)  BMI 38.36 kg/m2  SpO2 96% Physical Exam  Nursing note and vitals reviewed. Constitutional: She is oriented to person, place, and time. She appears well-developed and well-nourished.  HENT:  Head: Normocephalic and atraumatic.  Eyes: Conjunctivae are normal. Right eye exhibits no discharge. Left eye exhibits no discharge.  Neck: Normal range of motion. Neck supple. No tracheal deviation present.  Cardiovascular: Normal rate and regular rhythm.   Pulmonary/Chest: Effort normal. She has wheezes (end expiratory bilateral, mild tachypnea).  Musculoskeletal: She exhibits no edema.  Neurological: She is alert and oriented to person, place, and time.  Skin: Skin is warm. No rash noted.  Psychiatric: She has a normal mood and affect.    ED Course  Procedures (including critical care time) Labs Review Labs Reviewed - No data to display  Imaging Review No results found. No results found.    EKG Interpretation None      MDM   Final diagnoses:  Acute asthma exacerbation, moderate persistent   Patient clinically with acute asthma exacerbation. Plan for nebulizers, steroids, refill of her inhaler and followup outpatient.  Pt well  appearing, improved in ED.   Fup outpt Medications  albuterol (PROVENTIL,VENTOLIN) solution continuous neb (not administered)  predniSONE (DELTASONE) tablet 60 mg (not administered)  ipratropium-albuterol (DUONEB) 0.5-2.5 (3) MG/3ML nebulizer solution 3 mL (3 mLs Nebulization Given 10/14/13 0454)    Filed Vitals:   10/14/13 4098 10/14/13 0441 10/14/13 0442 10/14/13 0456  BP:   133/73   Pulse: 88     Temp: 97.9 F (36.6 C)      TempSrc: Oral     Resp: 22     Height:  (1.702 m)     Weight: 245 lb (111.131 kg)     SpO2: 98% 97%  96%    Final diagnoses:  Acute asthma exacerbation, moderate persistent       Enid Skeens, MD 10/16/13 0134

## 2013-10-14 NOTE — ED Notes (Signed)
Pt. Reports having an asthma attack at midnight. Pt. Reports she is out of her inhaler. No acute distress noted.

## 2013-10-14 NOTE — Discharge Instructions (Signed)
If you were given medicines take as directed.  If you are on coumadin or contraceptives realize their levels and effectiveness is altered by many different medicines.  If you have any reaction (rash, tongues swelling, other) to the medicines stop taking and see a physician.   Please follow up as directed and return to the ER or see a physician for new or worsening symptoms.  Thank you. Filed Vitals:   10/14/13 0439 10/14/13 0441 10/14/13 0442 10/14/13 0456  BP:   133/73   Pulse: 88     Temp: 97.9 F (36.6 C)     TempSrc: Oral     Resp: 22     Height:  (1.702 m)     Weight: 245 lb (111.131 kg)     SpO2: 98% 97%  96%    Asthma Asthma is a condition of the lungs in which the airways tighten and narrow. Asthma can make it hard to breathe. Asthma cannot be cured, but medicine and lifestyle changes can help control it. Asthma may be started (triggered) by:  Animal skin flakes (dander).  Dust.  Cockroaches.  Pollen.  Mold.  Smoke.  Cleaning products.  Hair sprays or aerosol sprays.  Paint fumes or strong smells.  Cold air, weather changes, and winds.  Crying or laughing hard.  Stress.  Certain medicines or drugs.  Foods, such as dried fruit, potato chips, and sparkling grape juice.  Infections or conditions (colds, flu).  Exercise.  Certain medical conditions or diseases.  Exercise or tiring activities. HOME CARE   Take medicine as told by your doctor.  Use a peak flow meter as told by your doctor. A peak flow meter is a tool that measures how well the lungs are working.  Record and keep track of the peak flow meter's readings.  Understand and use the asthma action plan. An asthma action plan is a written plan for taking care of your asthma and treating your attacks.  To help prevent asthma attacks:  Do not smoke. Stay away from secondhand smoke.  Change your heating and air conditioning filter often.  Limit your use of fireplaces and wood  stoves.  Get rid of pests (such as roaches and mice) and their droppings.  Throw away plants if you see mold on them.  Clean your floors. Dust regularly. Use cleaning products that do not smell.  Have someone vacuum when you are not home. Use a vacuum cleaner with a HEPA filter if possible.  Replace carpet with wood, tile, or vinyl flooring. Carpet can trap animal skin flakes and dust.  Use allergy-proof pillows, mattress covers, and box spring covers.  Wash bed sheets and blankets every week in hot water and dry them in a dryer.  Use blankets that are made of polyester or cotton.  Clean bathrooms and kitchens with bleach. If possible, have someone repaint the walls in these rooms with mold-resistant paint. Keep out of the rooms that are being cleaned and painted.  Wash hands often. GET HELP IF:  You have make a whistling sound when breaking (wheeze), have shortness of breath, or have a cough even if taking medicine to prevent attacks.  The colored mucus you cough up (sputum) is thicker than usual.  The colored mucus you cough up changes from clear or white to yellow, green, gray, or bloody.  You have problems from the medicine you are taking such as:  A rash.  Itching.  Swelling.  Trouble breathing.  You need reliever medicines  more than 2-3 times a week.  Your peak flow measurement is still at 50-79% of your personal best after following the action plan for 1 hour.  You have a fever. GET HELP RIGHT AWAY IF:   You seem to be worse and are not responding to medicine during an asthma attack.  You are short of breath even at rest.  You get short of breath when doing very little activity.  You have trouble eating, drinking, or talking.  You have chest pain.  You have a fast heartbeat.  Your lips or fingernails start to turn blue.  You are light-headed, dizzy, or faint.  Your peak flow is less than 50% of your personal best. MAKE SURE YOU:   Understand  these instructions.  Will watch your condition.  Will get help right away if you are not doing well or get worse. Document Released: 06/24/2007 Document Revised: 05/22/2013 Document Reviewed: 08/04/2012 [facility] Patient Information 2015 [place], [place]. This information is not intended to replace advice given to you by your health care provider. Make sure you discuss any questions you have with your health care provider.

## 2013-11-04 ENCOUNTER — Encounter (HOSPITAL_COMMUNITY): Payer: Self-pay | Admitting: Emergency Medicine

## 2013-11-04 ENCOUNTER — Emergency Department (HOSPITAL_COMMUNITY)
Admission: EM | Admit: 2013-11-04 | Discharge: 2013-11-05 | Disposition: A | Discharge status: Home / Self Care | Payer: Medicaid Other | Attending: Emergency Medicine | Admitting: Emergency Medicine

## 2013-11-04 DIAGNOSIS — R059 Cough, unspecified: Secondary | ICD-10-CM

## 2013-11-04 DIAGNOSIS — Z79899 Other long term (current) drug therapy: Secondary | ICD-10-CM | POA: Insufficient documentation

## 2013-11-04 DIAGNOSIS — R05 Cough: Secondary | ICD-10-CM

## 2013-11-04 DIAGNOSIS — J45901 Unspecified asthma with (acute) exacerbation: Secondary | ICD-10-CM | POA: Insufficient documentation

## 2013-11-04 DIAGNOSIS — Z87891 Personal history of nicotine dependence: Secondary | ICD-10-CM | POA: Insufficient documentation

## 2013-11-04 MED ORDER — ALBUTEROL SULFATE (2.5 MG/3ML) 0.083% IN NEBU
2.5000 mg | INHALATION_SOLUTION | Freq: Once | RESPIRATORY_TRACT | Status: AC
  Administered 2013-11-04: 2.5 mg via RESPIRATORY_TRACT
  Filled 2013-11-04: qty 3

## 2013-11-04 MED ORDER — PREDNISONE 10 MG PO TABS
60.0000 mg | ORAL_TABLET | Freq: Once | ORAL | Status: AC
  Administered 2013-11-05: 60 mg via ORAL
  Filled 2013-11-04 (×2): qty 1

## 2013-11-04 MED ORDER — IPRATROPIUM-ALBUTEROL 0.5-2.5 (3) MG/3ML IN SOLN
3.0000 mL | Freq: Once | RESPIRATORY_TRACT | Status: AC
  Administered 2013-11-04: 3 mL via RESPIRATORY_TRACT
  Filled 2013-11-04: qty 3

## 2013-11-04 NOTE — ED Notes (Signed)
Pt states she has had a persistent cough and congestion, using her inhaler without relief

## 2013-11-04 NOTE — ED Provider Notes (Signed)
CSN: [id number]     Arrival date & time 11/04/13  2335 History  This chart was scribed for Brenda LennertJoseph L Ally Knodel, MD by Modena JanskyAlbert Thayil, ED Scribe. This patient was seen in room APA19/[facility]19 and the patient's care was started at 11:46 PM.   Chief Complaint  Patient presents with  . Cough   Patient is a 23 y.o. female presenting with cough. The history is provided by the patient. No language interpreter was used.  Cough Cough characteristics:  Non-productive Severity:  Moderate Onset quality:  Gradual Duration:  3 days Timing:  Intermittent Chronicity:  New Relieved by:  Nothing Worsened by:  Nothing tried Ineffective treatments:  Decongestant and steroid inhaler Associated symptoms: wheezing   Associated symptoms: no chest pain, no eye discharge, no headaches and no rash    HPI Comments: Brenda Bauer is a 23 y.o. female with a hx of asthma who presents to the Emergency Department complaining of moderate intermittent cough that started about 3 days ago. She states that is currently having an asthma flare up. She reports that the cough is nonproductive and that she has associated wheezing and congestion. She states that she took her inhaler along with mucinex and robitussin without any relief.   Past Medical History  Diagnosis Date  . Asthma    No past surgical history on file. No family history on file. History  Substance Use Topics  . Smoking status: Former Smoker    Types: Cigarettes  . Smokeless tobacco: Not on file  . Alcohol Use: No   OB History   Grav Para Term Preterm Abortions TAB SAB Ect Mult Living                 Review of Systems  Constitutional: Negative for appetite change and fatigue.  HENT: Negative for congestion, ear discharge and sinus pressure.   Eyes: Negative for discharge.  Respiratory: Positive for cough and wheezing.   Cardiovascular: Negative for chest pain.  Gastrointestinal: Negative for abdominal pain and diarrhea.  Genitourinary: Negative for  frequency and hematuria.  Musculoskeletal: Negative for back pain.  Skin: Negative for rash.  Neurological: Negative for seizures and headaches.  Psychiatric/Behavioral: Negative for hallucinations.    Allergies  Review of patient's allergies indicates no known allergies.  Home Medications   Prior to Admission medications   Medication Sig Start Date End Date Taking? Authorizing Provider  albuterol (PROVENTIL HFA;VENTOLIN HFA) 108 (90 BASE) MCG/ACT inhaler Inhale 1-2 puffs into the lungs every 6 (six) hours as needed for wheezing or shortness of breath. 10/14/13   Enid SkeensJoshua M Zavitz, MD  norgestimate-ethinyl estradiol (ORTHO-CYCLEN,SPRINTEC,PREVIFEM) 0.25-35 MG-MCG tablet Take 1 tablet by mouth daily.    Historical Provider, MD  predniSONE (DELTASONE) 20 MG tablet Two tabs po qd x 5 days 06/23/13   Tammy L. Triplett, PA-C  predniSONE (DELTASONE) 20 MG tablet 2 tabs po daily x 3 days 10/14/13   Enid SkeensJoshua M Zavitz, MD   BP 136/61  Pulse 106  Resp 22  Ht 5\' 7"  (1.702 m)  Wt 240 lb (108.863 kg)  BMI 37.58 kg/m2  SpO2 100%  LMP 10/21/2013 Physical Exam  Nursing note and vitals reviewed. Constitutional: She is oriented to person, place, and time. She appears well-developed.  HENT:  Head: Normocephalic.  Eyes: Conjunctivae and EOM are normal. No scleral icterus.  Neck: Neck supple. No thyromegaly present.  Cardiovascular: Normal rate and regular rhythm.  Exam reveals no gallop and no friction rub.   No murmur heard. Pulmonary/Chest: No  stridor. She has wheezes. She has no rales. She exhibits no tenderness.  MIld wheezing bilaterally.   Abdominal: She exhibits no distension. There is no tenderness. There is no rebound.  Musculoskeletal: Normal range of motion. She exhibits no edema.  Lymphadenopathy:    She has no cervical adenopathy.  Neurological: She is oriented to person, place, and time. She exhibits normal muscle tone. Coordination normal.  Skin: No rash noted. No erythema.   Psychiatric: She has a normal mood and affect. Her behavior is normal.    ED Course  Procedures (including critical care time) DIAGNOSTIC STUDIES: Oxygen Saturation is 100% on RA, normal by my interpretation.    COORDINATION OF CARE: 11:50 PM- Pt advised of plan for treatment which includes medication and pt agrees.  Labs Review Labs Reviewed - No data to display  Imaging Review No results found.   EKG Interpretation None      MDM   Final diagnoses:  None   The chart was scribed for me under my direct supervision.  I personally performed the history, physical, and medical decision making and all procedures in the evaluation of this patient.Brenda Bauer.    Meilech Virts L Nanette Wirsing, MD 11/05/13 0111

## 2013-11-05 MED ORDER — ALBUTEROL SULFATE HFA 108 (90 BASE) MCG/ACT IN AERS: 1.0000 | INHALATION_SPRAY | Freq: Four times a day (QID) | RESPIRATORY_TRACT | Status: DC | PRN

## 2013-11-05 MED ORDER — PREDNISONE 10 MG PO TABS: 20.0000 mg | ORAL_TABLET | Freq: Every day | ORAL | Status: DC

## 2013-11-05 NOTE — Discharge Instructions (Signed)
Follow up as needed

## 2016-10-27 DIAGNOSIS — R531 Weakness: Secondary | ICD-10-CM | POA: Diagnosis not present

## 2016-10-27 DIAGNOSIS — Z7951 Long term (current) use of inhaled steroids: Secondary | ICD-10-CM | POA: Diagnosis not present

## 2016-10-27 DIAGNOSIS — R42 Dizziness and giddiness: Secondary | ICD-10-CM | POA: Diagnosis not present

## 2016-10-27 DIAGNOSIS — R110 Nausea: Secondary | ICD-10-CM | POA: Diagnosis not present

## 2016-10-27 DIAGNOSIS — J45909 Unspecified asthma, uncomplicated: Secondary | ICD-10-CM | POA: Diagnosis not present

## 2018-01-26 ENCOUNTER — Encounter: Payer: Self-pay | Admitting: Certified Nurse Midwife

## 2018-01-26 ENCOUNTER — Telehealth: Payer: Self-pay | Admitting: Certified Nurse Midwife

## 2018-01-26 ENCOUNTER — Ambulatory Visit (INDEPENDENT_AMBULATORY_CARE_PROVIDER_SITE_OTHER): Payer: BLUE CROSS/BLUE SHIELD | Admitting: Certified Nurse Midwife

## 2018-01-26 ENCOUNTER — Other Ambulatory Visit (HOSPITAL_COMMUNITY)
Admission: RE | Admit: 2018-01-26 | Discharge: 2018-01-26 | Disposition: A | Discharge status: Home / Self Care | Payer: BLUE CROSS/BLUE SHIELD | Source: Ambulatory Visit | Attending: Certified Nurse Midwife | Admitting: Certified Nurse Midwife

## 2018-01-26 ENCOUNTER — Other Ambulatory Visit: Payer: Self-pay

## 2018-01-26 VITALS — BP 102/70 | HR 64 | Resp 16 | Ht 67.5 in | Wt 265.0 lb

## 2018-01-26 DIAGNOSIS — Z01419 Encounter for gynecological examination (general) (routine) without abnormal findings: Secondary | ICD-10-CM | POA: Diagnosis not present

## 2018-01-26 DIAGNOSIS — N926 Irregular menstruation, unspecified: Secondary | ICD-10-CM

## 2018-01-26 DIAGNOSIS — N938 Other specified abnormal uterine and vaginal bleeding: Secondary | ICD-10-CM

## 2018-01-26 DIAGNOSIS — Z124 Encounter for screening for malignant neoplasm of cervix: Secondary | ICD-10-CM

## 2018-01-26 DIAGNOSIS — Z789 Other specified health status: Secondary | ICD-10-CM | POA: Diagnosis not present

## 2018-01-26 DIAGNOSIS — N912 Amenorrhea, unspecified: Secondary | ICD-10-CM | POA: Diagnosis not present

## 2018-01-26 DIAGNOSIS — Z3202 Encounter for pregnancy test, result negative: Secondary | ICD-10-CM

## 2018-01-26 DIAGNOSIS — E663 Overweight: Secondary | ICD-10-CM

## 2018-01-26 LAB — POCT URINE PREGNANCY: Preg Test, Ur: NEGATIVE

## 2018-01-26 NOTE — Progress Notes (Signed)
28 y.o. G0P0000 Married  Caucasian Fe here to establish gyn care and  for annual exam. Period history is irregular( all her life) and can not remember when LMP was. Starting spotting since late November off until now. Took UPT at home negative. UPT negative here today. No weight change that she is aware of.. Trying for pregnancy off and on, spouse has never fathered a child. Has taken OCP in the past for short term and would stop, worried about use and pregnancy. Does not remember last time used. Last pap ? Greater than ? Year or two. No history of abnormal. Would like to conceive and understand period issues. "Frustrated". No other health issues today.   No LMP recorded. (Menstrual status: Irregular Periods).   Spotting for over a month       Sexually active: Yes.    The current method of family planning is none.    Exercising: Yes.    physically active at work Smoker:  no  Review of Systems  Constitutional: Negative.   HENT: Negative.   Eyes: Negative.   Respiratory: Negative.   Cardiovascular: Negative.   Gastrointestinal: Negative.   Genitourinary:       Irregular cycles  Musculoskeletal: Negative.   Skin: Negative.   Neurological: Negative.   Endo/Heme/Allergies: Negative.   Psychiatric/Behavioral: Negative.     Health Maintenance: Pap:  2018?? 2017 History of Abnormal Pap: no MMG:  none Self Breast exams: yes Colonoscopy:  none BMD:  none TDaP:  Up to date Shingles: no Pneumonia: unsure Hep C and HIV: per patient neg Labs:  If needed   reports that she has quit smoking. Her smoking use included cigarettes. She has never used smokeless tobacco. She reports that she does not drink alcohol or use drugs.  Past Medical History:  Diagnosis Date  . Anxiety   . Asthma   . Asthma     Past Surgical History:  Procedure Laterality Date  . WISDOM TOOTH EXTRACTION      Current Outpatient Medications  Medication Sig Dispense Refill  . albuterol (PROVENTIL HFA;VENTOLIN HFA)  108 (90 BASE) MCG/ACT inhaler Inhale 1-2 puffs into the lungs every 6 (six) hours as needed for wheezing or shortness of breath. 1 Inhaler 0   No current facility-administered medications for this visit.     History reviewed. No pertinent family history.  ROS:  Pertinent items are noted in HPI.  Otherwise, a comprehensive ROS was negative.  Exam:   BP 102/70   Pulse 64   Resp 16   Ht 5' 7.5" (1.715 m)   Wt 265 lb (120.2 kg)   BMI 40.89 kg/m  Height: 5' 7.5" (171.5 cm) Ht Readings from Last 3 Encounters:  01/26/18 5' 7.5" (1.715 m)  11/04/13 5\' 7"  (1.702 m)  10/14/13 5\' 7"  (1.702 m)    General appearance: alert, cooperative and appears stated age Head: Normocephalic, without obvious abnormality, atraumatic Neck: no adenopathy, supple, symmetrical, trachea midline and thyroid normal to inspection and palpation Lungs: clear to auscultation bilaterally Breasts: normal appearance, no masses or tenderness, No nipple retraction or dimpling, No nipple discharge or bleeding, No axillary or supraclavicular adenopathy, Taught monthly breast self examination Heart: regular rate and rhythm Abdomen: soft, non-tender; no masses,  no organomegaly Extremities: extremities normal, atraumatic, no cyanosis or edema Skin: Skin color, texture, turgor normal. No rashes or lesions Lymph nodes: Cervical, supraclavicular, and axillary nodes normal. No abnormal inguinal nodes palpated Neurologic: Grossly normal   Pelvic: External genitalia:  no lesions,  normal female              Urethra:  normal appearing urethra with no masses, tenderness or lesions              Bartholin's and Skene's: normal                 Vagina: normal appearing vagina with normal color and blood noted in vaginal vault, no lesions              Cervix: no cervical motion tenderness, no lesions, nulliparous appearance and blood present from cervix              Pap taken: Yes.   Bimanual Exam:  Uterus:  normal size, contour,  position, consistency, mobility, non-tender and anteverted              Adnexa: normal adnexa and no mass, fullness, tenderness               Rectovaginal: Confirms               Anus:  normal sphincter tone, no lesions  Chaperone present: yes  A:  Well Woman with normal exam  Contraception none, hopeful for pregnancy, UPT negative today  Irregular period history with DUB and long term spotting,  No history of anemia  Overweight  P:   Reviewed health and wellness pertinent to exam.  Discussed daily OTC prenatal vitamin use when no contraception use and hopeful for pregnancy.(patient will start) Also checking rubella status and concerns discussed. Patient agreeable.  Discussed irregular menses and DUB can occur from weight change, pituitary issues, hormonal change, thyroid change. Would recommend labs to assess and PUS to assess due long history of spotting. Patient agreeable. Patient will be called with insurance information and scheduled. Also discussed contraception use sometimes to regulate cycle and then attempt pregnancy may be recommended. Patient would consider. Questions addressed at length. Stressed condom use now, so if medication needed, can safely use, with no concerns with pregnancy. Patient agreeable. Warning signs of heavy bleeding given and need to advise.  Labs: TSH,Prolactin, FSH, HGB A1-C, Rubella, CBC  Discussed working on weight change which can also helped with overall health and decrease risk with pregnancy. Will consider.  Pap smear: yes   counseled on breast self exam, feminine hygiene, adequate intake of calcium and vitamin D, diet and exercise  return annually or prn  An After Visit Summary was printed and given to the patient.

## 2018-01-26 NOTE — Patient Instructions (Signed)
General topics  Next pap or exam is  due in 1 year Take a Women's multivitamin Take 1200 mg. of calcium daily - prefer dietary If any concerns in interim to call back  Breast Self-Awareness Practicing breast self-awareness may pick up problems early, prevent significant medical complications, and possibly save your life. By practicing breast self-awareness, you can become familiar with how your breasts look and feel and if your breasts are changing. This allows you to notice changes early. It can also offer you some reassurance that your breast health is good. One way to learn what is normal for your breasts and whether your breasts are changing is to do a breast self-exam. If you find a lump or something that was not present in the past, it is best to contact your caregiver right away. Other findings that should be evaluated by your caregiver include nipple discharge, especially if it is bloody; skin changes or reddening; areas where the skin seems to be pulled in (retracted); or new lumps and bumps. Breast pain is seldom associated with cancer (malignancy), but should also be evaluated by a caregiver. BREAST SELF-EXAM The best time to examine your breasts is 5 7 days after your menstrual period is over.  ExitCare Patient Information 2013 [employer].   Exercise to Stay Healthy Exercise helps you become and stay healthy. EXERCISE IDEAS AND TIPS Choose exercises that:  You enjoy.  Fit into your day. You do not need to exercise really hard to be healthy. You can do exercises at a slow or medium level and stay healthy. You can:  Stretch before and after working out.  Try yoga, Pilates, or tai chi.  Lift weights.  Walk fast, swim, jog, run, climb stairs, bicycle, dance, or rollerskate.  Take aerobic classes. Exercises that burn about 150 calories:  Running 1  miles in 15 minutes.  Playing volleyball for 45 to 60 minutes.  Washing and waxing a car for 45 to 60  minutes.  Playing touch football for 45 minutes.  Walking 1  miles in 35 minutes.  Pushing a stroller 1  miles in 30 minutes.  Playing basketball for 30 minutes.  Raking leaves for 30 minutes.  Bicycling 5 miles in 30 minutes.  Walking 2 miles in 30 minutes.  Dancing for 30 minutes.  Shoveling snow for 15 minutes.  Swimming laps for 20 minutes.  Walking up stairs for 15 minutes.  Bicycling 4 miles in 15 minutes.  Gardening for 30 to 45 minutes.  Jumping rope for 15 minutes.  Washing windows or floors for 45 to 60 minutes. Document Released: 02/07/2010 Document Revised: 03/30/2011 Document Reviewed: 02/07/2010 ExitCare Patient Information 2013 [employer].   Other topics ( that may be useful information):    Sexually Transmitted Disease Sexually transmitted disease (STD) refers to any infection that is passed from person to person during sexual activity. This may happen by way of saliva, semen, blood, vaginal mucus, or urine. Common STDs include:  Gonorrhea.  Chlamydia.  Syphilis.  HIV/AIDS.  Genital herpes.  Hepatitis B and C.  Trichomonas.  Human papillomavirus (HPV).  Pubic lice. CAUSES  An STD may be spread by bacteria, virus, or parasite. A person can get an STD by:  Sexual intercourse with an infected person.  Sharing sex toys with an infected person.  Sharing needles with an infected person.  Having intimate contact with the genitals, mouth, or rectal areas of an infected person. SYMPTOMS  Some people may not have any symptoms, but   they can still pass the infection to others. Different STDs have different symptoms. Symptoms include:  Painful or bloody urination.  Pain in the pelvis, abdomen, vagina, anus, throat, or eyes.  Skin rash, itching, irritation, growths, or sores (lesions). These usually occur in the genital or anal area.  Abnormal vaginal discharge.  Penile discharge in men.  Soft, flesh-colored skin growths in the  genital or anal area.  Fever.  Pain or bleeding during sexual intercourse.  Swollen glands in the groin area.  Yellow skin and eyes (jaundice). This is seen with hepatitis. DIAGNOSIS  To make a diagnosis, your caregiver may:  Take a medical history.  Perform a physical exam.  Take a specimen (culture) to be examined.  Examine a sample of discharge under a microscope.  Perform blood test TREATMENT   Chlamydia, gonorrhea, trichomonas, and syphilis can be cured with antibiotic medicine.  Genital herpes, hepatitis, and HIV can be treated, but not cured, with prescribed medicines. The medicines will lessen the symptoms.  Genital warts from HPV can be treated with medicine or by freezing, burning (electrocautery), or surgery. Warts may come back.  HPV is a virus and cannot be cured with medicine or surgery.However, abnormal areas may be followed very closely by your caregiver and may be removed from the cervix, vagina, or vulva through office procedures or surgery. If your diagnosis is confirmed, your recent sexual partners need treatment. This is true even if they are symptom-free or have a negative culture or evaluation. They should not have sex until their caregiver says it is okay. HOME CARE INSTRUCTIONS  All sexual partners should be informed, tested, and treated for all STDs.  Take your antibiotics as directed. Finish them even if you start to feel better.  Only take over-the-counter or prescription medicines for pain, discomfort, or fever as directed by your caregiver.  Rest.  Eat a balanced diet and drink enough fluids to keep your urine clear or pale yellow.  Do not have sex until treatment is completed and you have followed up with your caregiver. STDs should be checked after treatment.  Keep all follow-up appointments, Pap tests, and blood tests as directed by your caregiver.  Only use latex condoms and water-soluble lubricants during sexual activity. Do not use  petroleum jelly or oils.  Avoid alcohol and illegal drugs.  Get vaccinated for HPV and hepatitis. If you have not received these vaccines in the past, talk to your caregiver about whether one or both might be right for you.  Avoid risky sex practices that can break the skin. The only way to avoid getting an STD is to avoid all sexual activity.Latex condoms and dental dams (for oral sex) will help lessen the risk of getting an STD, but will not completely eliminate the risk. SEEK MEDICAL CARE IF:   You have a fever.  You have any new or worsening symptoms. Document Released: 03/28/2002 Document Revised: 03/30/2011 Document Reviewed: 04/04/2010 [facility] Patient Information 2013 [place].    Domestic Abuse You are being battered or abused if someone close to you hits, pushes, or physically hurts you in any way. You also are being abused if you are forced into activities. You are being sexually abused if you are forced to have sexual contact of any kind. You are being emotionally abused if you are made to feel worthless or if you are constantly threatened. It is important to remember that help is available. No one has the right to abuse you. PREVENTION OF FURTHER  ABUSE  Learn the warning signs of danger. This varies with situations but may include: the use of alcohol, threats, isolation from friends and family, or forced sexual contact. Leave if you feel that violence is going to occur.  If you are attacked or beaten, report it to the police so the abuse is documented. You do not have to press charges. The police can protect you while you or the attackers are leaving. Get the officer's name and badge number and a copy of the report.  Find someone you can trust and tell them what is happening to you: your caregiver, a nurse, clergy member, close friend or family member. Feeling ashamed is natural, but remember that you have done nothing wrong. No one deserves abuse. Document Released:  01/03/2000 Document Revised: 03/30/2011 Document Reviewed: 03/13/2010 ExitCare Patient Information 2013 [employer]Care[employer].    How Much is Too Much Alcohol? Drinking too much alcohol can cause injury, accidents, and health problems. These types of problems can include:   Car crashes.  Falls.  Family fighting (domestic violence).  Drowning.  Fights.  Injuries.  Burns.  Damage to certain organs.  Having a baby with birth defects. ONE DRINK CAN BE TOO MUCH WHEN YOU ARE:  Working.  Pregnant or breastfeeding.  Taking medicines. Ask your doctor.  Driving or planning to drive. If you or someone you know has a drinking problem, get help from a doctor.  Document Released: 11/01/2008 Document Revised: 03/30/2011 Document Reviewed: 11/01/2008 ExitCare Patient Information 2013 [employer]Care[employer].   Smoking Hazards Smoking cigarettes is extremely bad for your health. Tobacco smoke has over 200 known poisons in it. There are over 60 chemicals in tobacco smoke that cause cancer. Some of the chemicals found in cigarette smoke include:   Cyanide.  Benzene.  Formaldehyde.  Methanol (wood alcohol).  Acetylene (fuel used in welding torches).  Ammonia. Cigarette smoke also contains the poisonous gases nitrogen oxide and carbon monoxide.  Cigarette smokers have an increased risk of many serious medical problems and Smoking causes approximately:  90% of all lung cancer deaths in men.  80% of all lung cancer deaths in women.  90% of deaths from chronic obstructive lung disease. Compared with nonsmokers, smoking increases the risk of:  Coronary heart disease by 2 to 4 times.  Stroke by 2 to 4 times.  Men developing lung cancer by 23 times.  Women developing lung cancer by 13 times.  Dying from chronic obstructive lung diseases by 12 times.  . Smoking is the most preventable cause of death and disease in our society.  WHY IS SMOKING ADDICTIVE?  Nicotine is the chemical  agent in tobacco that is capable of causing addiction or dependence.  When you smoke and inhale, nicotine is absorbed rapidly into the bloodstream through your lungs. Nicotine absorbed through the lungs is capable of creating a powerful addiction. Both inhaled and non-inhaled nicotine may be addictive.  Addiction studies of cigarettes and spit tobacco show that addiction to nicotine occurs mainly during the teen years, when young people begin using tobacco products. WHAT ARE THE BENEFITS OF QUITTING?  There are many health benefits to quitting smoking.   Likelihood of developing cancer and heart disease decreases. Health improvements are seen almost immediately.  Blood pressure, pulse rate, and breathing patterns start returning to normal soon after quitting. QUITTING SMOKING   American Lung Association - 1-800-LUNGUSA  American Cancer Society - 1-800-ACS-2345 Document Released: 02/13/2004 Document Revised: 03/30/2011 Document Reviewed: 10/17/2008 ExitCare Patient Information 2013 ExitCare,   LLC.   Stress Management Stress is a state of physical or mental tension that often results from changes in your life or normal routine. Some common causes of stress are:  Death of a loved one.  Injuries or severe illnesses.  Getting fired or changing jobs.  Moving into a new home. Other causes may be:  Sexual problems.  Business or financial losses.  Taking on a large debt.  Regular conflict with someone at home or at work.  Constant tiredness from lack of sleep. It is not just bad things that are stressful. It may be stressful to:  Win the lottery.  Get married.  Buy a new car. The amount of stress that can be easily tolerated varies from person to person. Changes generally cause stress, regardless of the types of change. Too much stress can affect your health. It may lead to physical or emotional problems. Too little stress (boredom) may also become stressful. SUGGESTIONS TO  REDUCE STRESS:  Talk things over with your family and friends. It often is helpful to share your concerns and worries. If you feel your problem is serious, you may want to get help from a professional counselor.  Consider your problems one at a time instead of lumping them all together. Trying to take care of everything at once may seem impossible. List all the things you need to do and then start with the most important one. Set a goal to accomplish 2 or 3 things each day. If you expect to do too many in a single day you will naturally fail, causing you to feel even more stressed.  Do not use alcohol or drugs to relieve stress. Although you may feel better for a short time, they do not remove the problems that caused the stress. They can also be habit forming.  Exercise regularly - at least 3 times per week. Physical exercise can help to relieve that "uptight" feeling and will relax you.  The shortest distance between despair and hope is often a good night's sleep.  Go to bed and get up on time allowing yourself time for appointments without being rushed.  Take a short "time-out" period from any stressful situation that occurs during the day. Close your eyes and take some deep breaths. Starting with the muscles in your face, tense them, hold it for a few seconds, then relax. Repeat this with the muscles in your neck, shoulders, hand, stomach, back and legs.  Take good care of yourself. Eat a balanced diet and get plenty of rest.  Schedule time for having fun. Take a break from your daily routine to relax. HOME CARE INSTRUCTIONS   Call if you feel overwhelmed by your problems and feel you can no longer manage them on your own.  Return immediately if you feel like hurting yourself or someone else. Document Released: 07/01/2000 Document Revised: 03/30/2011 Document Reviewed: 02/21/2007 [facility] Patient Information 2013 [place].   Dysfunctional Uterine Bleeding Dysfunctional uterine  bleeding is abnormal bleeding from the uterus. Dysfunctional uterine bleeding includes:  A menstrual period that comes earlier or later than usual.  A menstrual period that is lighter or heavier than usual, or has large blood clots.  Vaginal bleeding between menstrual periods.  Skipping one or more menstrual periods.  Vaginal bleeding after sex.  Vaginal bleeding after menopause. Follow these instructions at home: Eating and drinking   Eat well-balanced meals. Include foods that are high in iron, such as liver, meat, shellfish, green leafy vegetables, and eggs.  To prevent or treat constipation, your health care provider may recommend that you: ? Drink enough fluid to keep your urine pale yellow. ? Take over-the-counter or prescription medicines. ? Eat foods that are high in fiber, such as beans, whole grains, and fresh fruits and vegetables. ? Limit foods that are high in fat and processed sugars, such as fried or sweet foods. Medicines  Take over-the-counter and prescription medicines only as told by your health care provider.  Do not change medicines without talking with your health care provider.  Aspirin or medicines that contain aspirin may make the bleeding worse. Do not take those medicines: ? During the week before your menstrual period. ? During your menstrual period.  If you were prescribed iron pills, take them as told by your health care provider. Iron pills help to replace iron that your body loses because of this condition. Activity  If you need to change your sanitary pad or tampon more than one time every 2 hours: ? Lie in bed with your feet raised (elevated). ? Place a cold pack on your lower abdomen. ? Rest as much as possible until the bleeding stops or slows down.  Do not try to lose weight until the bleeding has stopped and your blood iron level is back to normal. General instructions   For two months, write down: ? When your menstrual period  starts. ? When your menstrual period ends. ? When any abnormal vaginal bleeding occurs. ? What problems you notice.  Keep all follow up visits as told by your health care provider. This is important. Contact a health care provider if you:  Feel light-headed or weak.  Have nausea and vomiting.  Cannot eat or drink without vomiting.  Feel dizzy or have diarrhea while you are taking medicines.  Are taking birth control pills or hormones, and you want to change them or stop taking them. Get help right away if:  You develop a fever or chills.  You need to change your sanitary pad or tampon more than one time per hour.  Your vaginal bleeding becomes heavier, or your flow contains clots more often.  You develop pain in your abdomen.  You lose consciousness.  You develop a rash. Summary  Dysfunctional uterine bleeding is abnormal bleeding from the uterus.  It includes menstrual bleeding of abnormal duration, volume, or regularity.  Bleeding after sex and after menopause are also considered dysfunctional uterine bleeding. This information is not intended to replace advice given to you by your health care provider. Make sure you discuss any questions you have with your health care provider. Document Released: 01/03/2000 Document Revised: 06/16/2017 Document Reviewed: 06/16/2017 Elsevier Interactive Patient Education  2019 [employer].

## 2018-01-26 NOTE — Telephone Encounter (Signed)
Spoke with patient in regards to benefits for recommended ultrasound. Patient understood benefits information presented. Patient advised she would like to discuss with her spouse before scheduling.   cc: Leota Sauerseborah Leonard, CNM

## 2018-01-27 ENCOUNTER — Ambulatory Visit: Payer: Self-pay | Admitting: Obstetrics & Gynecology

## 2018-01-27 ENCOUNTER — Telehealth: Payer: Self-pay | Admitting: Certified Nurse Midwife

## 2018-01-27 DIAGNOSIS — N926 Irregular menstruation, unspecified: Secondary | ICD-10-CM

## 2018-01-27 DIAGNOSIS — N912 Amenorrhea, unspecified: Secondary | ICD-10-CM

## 2018-01-27 DIAGNOSIS — N938 Other specified abnormal uterine and vaginal bleeding: Secondary | ICD-10-CM

## 2018-01-27 LAB — TSH: TSH: 1.45 u[IU]/mL (ref 0.450–4.500)

## 2018-01-27 LAB — HEMOGLOBIN A1C
Est. average glucose Bld gHb Est-mCnc: 117 mg/dL
Hgb A1c MFr Bld: 5.7 % — ABNORMAL HIGH (ref 4.8–5.6)

## 2018-01-27 LAB — FOLLICLE STIMULATING HORMONE: FSH: 4.6 m[IU]/mL

## 2018-01-27 LAB — PROLACTIN: Prolactin: 8.4 ng/mL (ref 4.8–23.3)

## 2018-01-27 LAB — RUBELLA SCREEN: Rubella Antibodies, IGG: <0.9 index — ABNORMAL LOW (ref >0.99)

## 2018-01-27 NOTE — Telephone Encounter (Signed)
Call to patient. Patient states that she forgot to discuss the anxiety she has been having at her appointment with Debbi yesterday. States that it's "every day anxiety." Leaves her house and "thinks of all the bad things that could happen. I have to make sure I turn all the burners off on the stove." States she can have panic attacks "just thinking about it." Denies using anxiety medication in the past. RN recommended OV to discuss anxiety. Patient aware of need to discuss in person, but declined stating she can't pay the co-pay right now. Patient states she also has not scheduled her PUS because she's not sure when she will be able to do that. RN advised patient that she can call and schedule appointment at her convenience. Patient agreeable. Advised would review with Debbi and return call if any additional recommendations. Patient agreeable.   Routing to provider for review.

## 2018-01-27 NOTE — Telephone Encounter (Signed)
Message   I was talking to the lady doing my blood work about how my anxiety has been really bad as I've gotten older but I haven't been to a dr about it because I don't have a primary doctor yet . She told  Me I could talk to you about it and see what we could do about getting me something for that. I'm sorry I didn't mention it in office I honestly didn't think I could .

## 2018-01-28 LAB — CYTOLOGY - PAP
DIAGNOSIS: NEGATIVE
HPV (WINDOPATH): NOT DETECTED

## 2018-01-28 NOTE — Telephone Encounter (Signed)
-----   Message from Verner Chol, CNM sent at 01/28/2018 12:15 PM EST ----- Notify patient her TSH, FSH and prolactin level is normal. Hgb A1-C is elevated and in low prediabetes range. She needs to work on diet decreasing carbohydrates and sugars, increase in lean protein and vegetables and limited fruits, with some regular exercise Her rubella screen does not show immunity. She would need to re-immunize, but can not be done if concerns with irregular period and ? pregnancy. Pap smear is negative, HPV not detected. 02 She needs to have PUS(as we discussed in office to evaluate the spotting and also to determine if PCOS might be part of the problem, so we can help with management with her periods and fertility.

## 2018-01-28 NOTE — Telephone Encounter (Signed)
She can be seen by behavior health at [facility] or with psychiatrist. We can help her with scheduling.

## 2018-01-31 NOTE — Telephone Encounter (Signed)
See 01/31/2018 phone note. Will close this encounter

## 2018-01-31 NOTE — Telephone Encounter (Signed)
Patient returned call. Spoke with patient in regards to scheduling recommended ultrasound. Patient continues to decline scheduling, adding she wants to "wait at least tow more weeks" before scheduling.  Patient is also requesting lab results  Forwarding to Triage for review

## 2018-01-31 NOTE — Telephone Encounter (Signed)
See previous phone note on 01/26/2018. Call paced to patient to follow up in regards to scheduling recommended ultrasound. Left voicemail message requesting a return call

## 2018-02-01 NOTE — Telephone Encounter (Signed)
Spoke with patient.  She is given results. Verbalizes understanding.  She states she cannot complete ultrasound this time. She declines office visit to discuss. She is asking to start birth control and states she asked for this when she was here for her visit.  Discussed reasons for ultrasound needed for diagnosis of pcos and irregular bleeding.  Pt states she simply cannot schedule ultrasound at this and would like an option for treatment for irregular cycles.  She has not been sexually active since office visit.

## 2018-02-01 NOTE — Telephone Encounter (Signed)
Patient returned call and now would like to schedule ultrasound as soon as possible.  Scheduled with Dr. Hyacinth Meeker 02/04/2018.  She will call back if cannot keep appointment after speaking with her Mother.

## 2018-02-03 ENCOUNTER — Encounter: Payer: Self-pay | Admitting: Obstetrics & Gynecology

## 2018-02-03 ENCOUNTER — Ambulatory Visit (INDEPENDENT_AMBULATORY_CARE_PROVIDER_SITE_OTHER): Payer: BLUE CROSS/BLUE SHIELD

## 2018-02-03 ENCOUNTER — Ambulatory Visit: Payer: BLUE CROSS/BLUE SHIELD | Admitting: Obstetrics & Gynecology

## 2018-02-03 VITALS — BP 110/70 | HR 68 | Resp 16 | Ht 67.5 in | Wt 270.0 lb

## 2018-02-03 DIAGNOSIS — N926 Irregular menstruation, unspecified: Secondary | ICD-10-CM

## 2018-02-03 DIAGNOSIS — N938 Other specified abnormal uterine and vaginal bleeding: Secondary | ICD-10-CM | POA: Diagnosis not present

## 2018-02-03 DIAGNOSIS — E282 Polycystic ovarian syndrome: Secondary | ICD-10-CM

## 2018-02-03 DIAGNOSIS — N912 Amenorrhea, unspecified: Secondary | ICD-10-CM | POA: Diagnosis not present

## 2018-02-03 MED ORDER — METFORMIN HCL 500 MG PO TABS: ORAL_TABLET | ORAL | 3 refills | Status: DC

## 2018-02-03 MED ORDER — MEDROXYPROGESTERONE ACETATE 10 MG PO TABS: 10.0000 mg | ORAL_TABLET | Freq: Every day | ORAL | 0 refills | Status: DC

## 2018-02-03 NOTE — Progress Notes (Addendum)
28 y.o. G0P0000 Married White or Caucasian female here for pelvic ultrasound due to oligomenorrhea and concerns for PCOS.  She would really like to be pregnant as well so hopefully can discuss some of these concerns today.  Reports she is very nervous today.  Has long hx of irregular cycles.  Can skip months at a time between cycles.  Only time cycles were normal is when on OCPs.  Has never taken them for significant period of time due to concerns about OCP risks.    Is on PNV.  Did have rubella testing obtained 01/26/2018.  This was low and did not show immunity.  D/w pt today and need for vaccination.  Live vaccine discussed as well and recommendation for no pregnancy for 1 month after vaccination.  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  No LMP recorded. (Menstrual status: Irregular Periods).  Contraception: none  Findings:  UTERUS: 8.1 x 3.8 x 3.3cm EMS: 9.32m ADNEXA: Left ovary:  4.6 x 3.2 x 2.5cm.  Volume 20cm3       Right ovary: 5.4 x 2.8 x 3.8cm.  Volume 31cm3.  Both ovaries with peripheral follicles c/w PCOS CUL DE SAC: no free fluid  Discussion:  Enlarged ovaries bilaterally c/w PCOS.  NPetra Kubaof this disorder reviewed.  Evaluation and treatment for infertility discussed as well including   Assessment:  Oligmenorrhea PCOS Obesity  Plan:  Will start provera 134mx 10 days to trigger cycle Once cycle begins, will start Femara 5.52m93mays 5-9 of cycle.  Will need Day 23 progesterone with this to ensure ovulation.  Will increased to 7.5mg22mys 5-9 if not ovulating.  Pt is aware if this does not work, she will need referral to REI.  Weight loss and improved response to Femara reviewed.  Twin and triple rate with ovulation induction agents reviewed. Will start glucophage 5052mg652mly x 2 weeks and then increase to BID x 2 weeks and then increased to 10052mg 45mm and 5052mg i77m x 2 weeks and then up to 1gram BID as tolerated.  Aware this  will help with ovulation and possibly some weight loss.  GI distress side effects reviewed.   Pt will return for fasting insulin level.  Hb A1C was 5.7 May need to consider semen analysis in the future as well as SHGM if ovulating regularly but not pregnant Updated MMR recommended.  She declines.  Aware I disagree with this decision.  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   Continue PNV.  ~30 minutes spent with patient >50% of time was in face to face discussion of above.

## 2018-02-03 NOTE — Telephone Encounter (Signed)
Spoke with patient. Reviewed AVS with patient with medication instructions for provera and Glucophage. Questions answered. Patient read back instructions. Advised to return call to office if any additional questions or concerns.   Routing to provider for final review. Patient is agreeable to disposition. Will close encounter.

## 2018-02-03 NOTE — Telephone Encounter (Signed)
Message   Just to make 100% sure you want me to start the Provera tomorrow as well as the sugar medicine

## 2018-02-03 NOTE — Patient Instructions (Addendum)
I would highly recommend you have have a rubella vaccine.  If you do, you SHOULD NOT try or get pregnant for the next four weeks.  You have PCOS based on your ultrasound findings and your irregular bleeding.    I would like to test a fasting insulin level just so I know where we are starting.  You are going to start glucophage 500mg  in the AM for two weeks.  Then you are going to increase 500mg  twice daily for two more weeks.  Then you will increase to two tablets in the morning and one at night for another two weeks.  Then increase to 2 tabs twice daily.  If you have diarrhea, please let me know and decrease to the dosage where you weren't having these issues.  To start your cycle, you need to take provera 10mg  x 10 days.  Typically your bleeding starts within 7 days of stopping the provera.  Your cycle may be heavy.    Once your cycle starts, you need to call and let me know what day your cycle started.  The I will send in Femara 5mg  days 5-9 of your cycle. Day 1 is your first day of bleeding.  Then I will test on day 23 of your cycle a progesterone level.  This tells me if you ovulated.  Typically if you ovulate, you will start your cycle on Day 31.    A semen analysis would be recommended if you are ovulatory for 3 months and not pregnant.   Low glycemic index foods.

## 2018-02-04 ENCOUNTER — Other Ambulatory Visit (INDEPENDENT_AMBULATORY_CARE_PROVIDER_SITE_OTHER): Payer: BLUE CROSS/BLUE SHIELD

## 2018-02-04 ENCOUNTER — Telehealth: Payer: Self-pay | Admitting: Obstetrics & Gynecology

## 2018-02-04 DIAGNOSIS — E282 Polycystic ovarian syndrome: Secondary | ICD-10-CM | POA: Diagnosis not present

## 2018-02-04 DIAGNOSIS — N926 Irregular menstruation, unspecified: Secondary | ICD-10-CM | POA: Diagnosis not present

## 2018-02-04 NOTE — Telephone Encounter (Signed)
Patient has a question regarding taking a prescripton. She was unable to pick it up yesterday and now is not sure when she should take it.

## 2018-02-04 NOTE — Telephone Encounter (Signed)
Spoke with patient. Patient did not pick up medications yesterday, asking if OK to start today or tomorrow?   Advised ok to start provera today or tomorrow, take roughly same time daily. Can start metformin tomorrow morning. Questions answered.   Routing to provider for final review. Patient is agreeable to disposition. Will close encounter.

## 2018-02-05 LAB — INSULIN, RANDOM: INSULIN: 27.9 u[IU]/mL — AB (ref 2.6–24.9)

## 2018-02-14 ENCOUNTER — Telehealth: Payer: Self-pay | Admitting: Obstetrics & Gynecology

## 2018-02-14 ENCOUNTER — Encounter: Payer: Self-pay | Admitting: Obstetrics & Gynecology

## 2018-02-14 NOTE — Telephone Encounter (Signed)
Patient was told to call when she started her cycle. Patient started her cycle this morning and is now calling to get Femara 5.0mg  called in.

## 2018-02-14 NOTE — Telephone Encounter (Signed)
Patient sent the following correspondence through MyChart. Routing to triage to assist patient with request.  Started seeing blood this morning !     Finished the 10 pills off yesterday. I can't remember the name exactly haha .

## 2018-02-14 NOTE — Telephone Encounter (Signed)
Patient checking the status of message from this morning.

## 2018-02-14 NOTE — Telephone Encounter (Signed)
Reviewed OV notes dated 02/03/18: Once cycle begins, will start Femara 5.0mg  days 5-9 of cycle. Will need Day 23 progesterone with this to ensure ovulation.   Rx pended for Femara 5mg  daily PO days 5-9 of cycle (1/31 -2/4). #10/0RF  Return for day 23 progesterone on 03/08/18.   Routing to Dr. Hyacinth Meeker to review Rx.

## 2018-02-14 NOTE — Telephone Encounter (Signed)
Reviewed notes.  Pt was to call with cycle.  Today is cycle day one.  Requesting Femara Rx.

## 2018-02-15 NOTE — Telephone Encounter (Signed)
Spoke with patient, advised provider out of office due to illness, will return call once Rx reviewed by provider. Advised menses started on 1/27, first dose of Femara would be due on day 5 of cycle, 02/18/18.   Scheduled patient for day 23 progesterone on 2/18 at 9am. Patient verbalizes understanding and is agreeable.

## 2018-02-15 NOTE — Telephone Encounter (Signed)
Routing to Dr. Miller

## 2018-02-15 NOTE — Telephone Encounter (Signed)
Patient called again checking status of prescription.

## 2018-02-16 ENCOUNTER — Other Ambulatory Visit: Payer: Self-pay | Admitting: Obstetrics & Gynecology

## 2018-02-16 MED ORDER — LETROZOLE 2.5 MG PO TABS
ORAL_TABLET | ORAL | 0 refills | Status: DC
Start: 2018-02-16 — End: 2018-03-22

## 2018-02-16 NOTE — Telephone Encounter (Signed)
Spoke with patient, advised as seen below per Dr. Hyacinth Meeker. Patient read back femara instructions. Patient verbalizes understanding and is agreeable.   Encounter closed.

## 2018-02-16 NOTE — Progress Notes (Signed)
fermara order placed.

## 2018-02-16 NOTE — Telephone Encounter (Signed)
Order for Brenda Bauer has been sent to pharmacy.  Please make sure she understands importance of day 23 progesterone level.  It must be done that specific date--03/08/2018.  Thanks.

## 2018-03-08 ENCOUNTER — Other Ambulatory Visit: Payer: Self-pay | Admitting: Emergency Medicine

## 2018-03-08 ENCOUNTER — Other Ambulatory Visit (INDEPENDENT_AMBULATORY_CARE_PROVIDER_SITE_OTHER): Payer: BLUE CROSS/BLUE SHIELD

## 2018-03-08 DIAGNOSIS — E282 Polycystic ovarian syndrome: Secondary | ICD-10-CM

## 2018-03-08 DIAGNOSIS — N912 Amenorrhea, unspecified: Secondary | ICD-10-CM | POA: Diagnosis not present

## 2018-03-08 DIAGNOSIS — N926 Irregular menstruation, unspecified: Secondary | ICD-10-CM

## 2018-03-09 ENCOUNTER — Encounter: Payer: Self-pay | Admitting: Obstetrics & Gynecology

## 2018-03-09 ENCOUNTER — Telehealth: Payer: Self-pay | Admitting: Obstetrics & Gynecology

## 2018-03-09 LAB — PROGESTERONE: Progesterone: 2 ng/mL

## 2018-03-09 NOTE — Telephone Encounter (Signed)
-----   Message from Jerene Bears, MD sent at 03/09/2018 10:33 AM EST ----- Please let pt know her progesterone level was 2 and does not show ovulation.  How much Glucophage is she taking?  She needs to let me know when starts cycle or if gets to day 30 and has not had any bleeding.  She may need provera again to trigger her cycle.

## 2018-03-09 NOTE — Telephone Encounter (Signed)
° °  Message   Was wondering how my blood work was from yesterday. Ovulation or no?

## 2018-03-09 NOTE — Telephone Encounter (Signed)
Call to patient. Advised of progesterone results and instructions from Dr Hyacinth Meeker.  She is taking Glucophage 500 mg- 2 tabs in am and 1 tab in pm.    LMP 02-14-2018.  Advised to call with onset of menses on if no menses by cycle day 30, 03-16-2018. Patient agreeable.    Routing to Dr Hyacinth Meeker regarding glucophage dose.

## 2018-03-11 NOTE — Telephone Encounter (Signed)
If she can increase to 500mg  BID, that would be good, but it is ok to stay where she is if the high dosage caused any GI issues or if she is having mild GI issues at her current dosage.    Will increase the Femara next month.  If this does not to cause ovulation after a few months, she will need to be seen by fertility specialist.  They will recommend working on weight loss as well so she may want to put some thought into options and I will have a suggestion or two as well.

## 2018-03-11 NOTE — Telephone Encounter (Signed)
Call to patient. Advised of instructions from Dr Hyacinth Meeker. Patient tolerating current dose of Glucophage without side effects so agreeable to increase dose.  Patient will call with menses or cycle day 30.   Encounter closed.

## 2018-03-15 ENCOUNTER — Telehealth: Payer: Self-pay | Admitting: Obstetrics & Gynecology

## 2018-03-15 ENCOUNTER — Encounter: Payer: Self-pay | Admitting: Obstetrics & Gynecology

## 2018-03-15 NOTE — Telephone Encounter (Signed)
Patient sent the following correspondence through MyChart. Routing to triage to assist patient with request.  Still no period.Marland Kitchen

## 2018-03-15 NOTE — Telephone Encounter (Signed)
Spoke with patient.   1. Currently take Glucophage 1000 mg bid, missed this mornings dose. Has not missed any prior, asking if she should double tonight? Does not have her medication with her. Advised not to double dose, skip missed dose and take tonight's dose as scheduled.   2. LMP 02/14/18. No cramping or menses to date. Was advised to call if no menses by cycle day 30, may need provera.   Advised I will update Dr. Hyacinth Meeker and return call with recommendations. Advised Dr. Hyacinth Meeker is in the OR, response may not be immediate. Patient agreeable.   Routing to Dr. Hyacinth Meeker.

## 2018-03-16 ENCOUNTER — Encounter: Payer: Self-pay | Admitting: Obstetrics & Gynecology

## 2018-03-16 MED ORDER — MEDROXYPROGESTERONE ACETATE 10 MG PO TABS: 10.0000 mg | ORAL_TABLET | Freq: Every day | ORAL | 0 refills | Status: DC

## 2018-03-16 NOTE — Telephone Encounter (Signed)
Have her take home UPT and if negative needs to start provera 10mg  x 10 days.  Ok to call in rx.  She will need to call with onset of cycle for the femara 7.5mg  dosage.  Thanks.

## 2018-03-16 NOTE — Telephone Encounter (Signed)
Left message to call Jalei Shibley, RN at [facility] [phone].   

## 2018-03-16 NOTE — Telephone Encounter (Signed)
Spoke with patient. No menses to date. Advised as seen below per Dr. Hyacinth Meeker, Rx for provera to verified pharmacy. Patient to return call with onset of menses. Patient verbalizes understanding and is agreeable.   Encounter closed.

## 2018-03-18 ENCOUNTER — Telehealth: Payer: Self-pay | Admitting: Certified Nurse Midwife

## 2018-03-18 NOTE — Telephone Encounter (Signed)
Spoke with patient.  She has not taken a pregnancy test yet. She will tonight.  If pregnancy negative, advised to start provera. Call office with day one of cycle.   If pregnancy test positive and has continued pain or develops heavy vaginal bleeding go to closest ED for evaluation.  Pt agreeable to plan and verbalizes understanding. She will call back with cycle day one or any further questions.   Routing to Dr. Hyacinth Meeker and will close.

## 2018-03-18 NOTE — Telephone Encounter (Signed)
Patient is cramping like she is starting her period. She want to know if she should still start the medication to start her cycle today.p

## 2018-03-22 ENCOUNTER — Other Ambulatory Visit: Payer: Self-pay | Admitting: Obstetrics & Gynecology

## 2018-03-22 ENCOUNTER — Telehealth: Payer: Self-pay | Admitting: Obstetrics & Gynecology

## 2018-03-22 ENCOUNTER — Encounter: Payer: Self-pay | Admitting: Obstetrics & Gynecology

## 2018-03-22 MED ORDER — LETROZOLE 2.5 MG PO TABS: ORAL_TABLET | ORAL | 0 refills | Status: DC

## 2018-03-22 NOTE — Telephone Encounter (Signed)
Rx sent to pharmacy for 7.5mg  dosage days 5-9 of cycle.  I wouldn't count today as first day of bleeding if only has spotting tomorrow.  If cycle really starts tomorrow then can count today as day #1.  Thanks.

## 2018-03-22 NOTE — Telephone Encounter (Signed)
Dr. Hyacinth Meeker -Please advise on Femara Rx.

## 2018-03-22 NOTE — Telephone Encounter (Signed)
Left detailed message, ok per dpr, advised as seen below per Dr. Hyacinth Meeker. Return call to office if any additional questions or concerns.   Encounter closed.

## 2018-03-22 NOTE — Telephone Encounter (Signed)
Patient sent the following correspondence through MyChart. Routing to triage to assist patient with request.  Spotting when I wipe.

## 2018-03-23 ENCOUNTER — Telehealth: Payer: Self-pay | Admitting: *Deleted

## 2018-03-23 NOTE — Telephone Encounter (Signed)
Patient says she is having a full period and wondering if she should continue taking medication.

## 2018-03-23 NOTE — Telephone Encounter (Signed)
Spoke with patient. Full menses started today, spotting yesterday, has not taken provera, asking if she should take provera?   Advised patient not to take provera since menses started spontaneously. Count today as first day of cycle, take Femara 7.5 mg daily on days 5 -9 of cycle. Advised to start Femara on 03/27/18. Patient read back instructions. Advised Dr. Hyacinth Meeker will review, our office will return call if any additional recommendations.   Routing to provider for final review. Patient is agreeable to disposition. Will close encounter.

## 2018-03-28 ENCOUNTER — Encounter: Payer: Self-pay | Admitting: Obstetrics & Gynecology

## 2018-03-28 ENCOUNTER — Telehealth: Payer: Self-pay | Admitting: Obstetrics & Gynecology

## 2018-03-28 DIAGNOSIS — E282 Polycystic ovarian syndrome: Secondary | ICD-10-CM

## 2018-03-28 DIAGNOSIS — N926 Irregular menstruation, unspecified: Secondary | ICD-10-CM

## 2018-03-28 NOTE — Telephone Encounter (Addendum)
Okay to order cycle day 23 Progesterone for patient?  Cycle day 1 03/23/2018, is to start Femara days 5-9.

## 2018-03-28 NOTE — Telephone Encounter (Signed)
Message   I didn't last time but I will buy some Friday and do that. Hopefully I ovulate this time ! What day do I need to come do blood work to make sure . If I don't ovulate this time i will schedule a visit again

## 2018-03-28 NOTE — Telephone Encounter (Signed)
Routing to Dr. Hyacinth Meeker for advice on timed intercourse for this patient.   I am going to respond to her via mychart and encourage her to make another appointment as she has multiple questions about process ongoing if that is okay with you.

## 2018-03-28 NOTE — Telephone Encounter (Signed)
You can tell her days days 12-18 if she ovulates and offer her an appointment.  Thanks.

## 2018-03-28 NOTE — Addendum Note (Signed)
Addended by: Joeseph Amor on: 03/28/2018 02:32 PM   Modules accepted: Orders

## 2018-03-28 NOTE — Telephone Encounter (Signed)
Patient sent the following correspondence through MyChart. Routing to triage to assist patient with request.  Message   What days should I be active to get pregnant . I'll be at work if you could send me a message back that be perfect!

## 2018-03-28 NOTE — Telephone Encounter (Signed)
Mychart message response to patient

## 2018-03-29 NOTE — Telephone Encounter (Signed)
Yes, she should return for day 23 progesterone level. Thanks.

## 2018-03-29 NOTE — Telephone Encounter (Signed)
Responded to patient via mychart

## 2018-04-13 ENCOUNTER — Encounter: Payer: Self-pay | Admitting: Obstetrics & Gynecology

## 2018-04-14 ENCOUNTER — Other Ambulatory Visit: Payer: BLUE CROSS/BLUE SHIELD

## 2018-04-14 ENCOUNTER — Other Ambulatory Visit: Payer: Self-pay

## 2018-04-14 DIAGNOSIS — N926 Irregular menstruation, unspecified: Secondary | ICD-10-CM | POA: Diagnosis not present

## 2018-04-14 DIAGNOSIS — E282 Polycystic ovarian syndrome: Secondary | ICD-10-CM | POA: Diagnosis not present

## 2018-04-15 LAB — PROGESTERONE: PROGESTERONE: 10.3 ng/mL

## 2018-04-18 ENCOUNTER — Telehealth: Payer: Self-pay | Admitting: Obstetrics & Gynecology

## 2018-04-18 DIAGNOSIS — J4520 Mild intermittent asthma, uncomplicated: Secondary | ICD-10-CM | POA: Diagnosis not present

## 2018-04-18 NOTE — Telephone Encounter (Signed)
Patient is asking for a letter from Dr.Miller excusing her from work at [employer] due to her asthma and trying to get pregnant. She is concerned because of coronavirus.

## 2018-04-18 NOTE — Telephone Encounter (Signed)
Return call to patient. States she is expecting call from Tele-doc right now. 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 She is concerned about her risk with asthma and attempting pregnancy.  Requested note for work related to her risk.  Advised this would be reviewed by provider. Not aware if this is something GYN provider can do. She states the tele-doc visit is to request the rote as well. Advised patient to call back if they are unable to provide note she needs.

## 2018-04-18 NOTE — Telephone Encounter (Signed)
Reviewed with Dr Oscar La (on call for Dr Hyacinth Meeker). Per Dr Oscar La, PCP would need to write patient out of work for asthma, if needed.

## 2018-04-24 ENCOUNTER — Encounter: Payer: Self-pay | Admitting: Obstetrics & Gynecology

## 2018-04-25 ENCOUNTER — Telehealth: Payer: Self-pay | Admitting: *Deleted

## 2018-04-25 MED ORDER — LETROZOLE 2.5 MG PO TABS: ORAL_TABLET | ORAL | 0 refills | Status: DC

## 2018-04-25 NOTE — Telephone Encounter (Signed)
Ok for Femara 7.5 mg, days number 5 - 9 of cycle. Timed intercourse.

## 2018-04-25 NOTE — Telephone Encounter (Signed)
Spoke with patient, advised as seen below per Dr. Edward Jolly. Patient reports menses started on 04/24/18. Will start femara on 04/28/18. Rx to verified pharmacy. Patient verbalizes understanding and is agreeable.   Encounter closed.

## 2018-04-25 NOTE — Telephone Encounter (Signed)
Started my period today!     WITHOUT MEDS !!     Just letting you know ! Thanks

## 2018-04-25 NOTE — Telephone Encounter (Signed)
Per review of Epic, 04/14/18 day 23 progesterone 10.3, patient to notify when menses starts. Has been taking femara.   Routing to covering provider for review.

## 2018-05-11 ENCOUNTER — Other Ambulatory Visit: Payer: Self-pay | Admitting: Obstetrics & Gynecology

## 2018-05-11 NOTE — Telephone Encounter (Signed)
Medication refill request: glucophage 500mg  take 2 bid Last AEX:  01-26-2018 Next AEX: not yet scheduled Last MMG (if hormonal medication request): none Refill authorized: rx last sent 02-03-2018. Please approve if appropriate.

## 2018-05-19 ENCOUNTER — Other Ambulatory Visit: Payer: Self-pay | Admitting: Obstetrics and Gynecology

## 2018-05-19 NOTE — Telephone Encounter (Signed)
Please call pt.  Rx refill request came but I typically don't do this until I know she's started her cycle.  Can you please get an update?  Thanks.

## 2018-05-19 NOTE — Telephone Encounter (Signed)
Medication refill request: Letrozole Last AEX:  01/26/18 DL Next AEX: none scheduled Last MMG (if hormonal medication request): n/a Refill authorized: Please advise if patient needs this refilled.

## 2018-05-19 NOTE — Telephone Encounter (Signed)
Spoke with patient, she stated that she has not started her cycle yet, she does, however, have a home pregnancy test and will test if she has not started her period by 05/24/18 which is around the time she would normal start.

## 2018-05-23 ENCOUNTER — Telehealth: Payer: Self-pay | Admitting: *Deleted

## 2018-05-23 ENCOUNTER — Encounter: Payer: Self-pay | Admitting: Obstetrics & Gynecology

## 2018-05-23 NOTE — Telephone Encounter (Signed)
Pharmacy aware we are waiting on callback from patient on 05-24-2018 to see if she needs this.

## 2018-05-23 NOTE — Telephone Encounter (Signed)
[facility] calling to check on request for Letrozole. [phone]

## 2018-05-23 NOTE — Telephone Encounter (Signed)
Last Femara Rx sent on 04/25/18. LMP 05/23/18  Routing to Dr. Hyacinth Meeker to review and advise.

## 2018-05-23 NOTE — Telephone Encounter (Signed)
Started my period today, on time again!

## 2018-05-24 ENCOUNTER — Other Ambulatory Visit: Payer: Self-pay | Admitting: Obstetrics & Gynecology

## 2018-05-24 MED ORDER — LETROZOLE 2.5 MG PO TABS: ORAL_TABLET | ORAL | 0 refills | Status: DC

## 2018-05-24 NOTE — Telephone Encounter (Signed)
Rx sent for pt to take Femara 7.5mg  days 5-9 this month.  Please ask her to let me know if/when cycle starts.  Of course, it if it late, she needs to take a UPT.  Thanks.

## 2018-05-24 NOTE — Telephone Encounter (Signed)
Spoke with patient. Advised as seen below per Dr. Hyacinth Meeker. Patient states she took 3 UPT on 05/20/18 one with a "possible faint line" and two that read "not pregnant".  States she is unsure that there was a line, "just want one to be there". Patient states she showed test to pharmacist, was advised no line visible. Took a 4th test on 5/4, negative, menses started later that day. Describes bleeding as normal cycle, no pain. Advised I will review with Dr. Hyacinth Meeker and return call. Patient agreeable.   Call reviewed with Dr. Hyacinth Meeker, call returned to patient. Advised if bleeding is like normal menses and no pain ok to continue with Femara. Offered lab appt for beta hcg, patient declined. Patient verbalize understanding and is agreeable. Aware to call with any concerns.   Routing to provider for final review. Patient is agreeable to disposition. Will close encounter.

## 2018-06-27 ENCOUNTER — Telehealth: Payer: Self-pay | Admitting: Obstetrics & Gynecology

## 2018-06-27 ENCOUNTER — Encounter: Payer: Self-pay | Admitting: Obstetrics & Gynecology

## 2018-06-27 NOTE — Telephone Encounter (Signed)
Patient started Femara 7.5mg  days 5-9 in 01/2018. Routing to Dr. Sabra Heck to review and advise.

## 2018-06-27 NOTE — Telephone Encounter (Signed)
Patient sent the following correspondence through [place]. Routing to triage to assist patient with request.  I started my period last night .

## 2018-06-28 MED ORDER — LETROZOLE 2.5 MG PO TABS: ORAL_TABLET | ORAL | 0 refills | Status: DC

## 2018-06-28 NOTE — Telephone Encounter (Signed)
Rx sent to pharmacy on file.  Please let her know and to let me know if misses cycle or when cycle starts.  Will continue for six cycles but if no pregnancy after six, will need referral to REI.  Just want to review the plan.  Thanks.

## 2018-06-28 NOTE — Telephone Encounter (Signed)
Spoke with patient, advised as seen below per Dr. Miller. Patient verbalizes understanding and is agreeable.   Encounter closed.  

## 2018-08-05 ENCOUNTER — Other Ambulatory Visit: Payer: Self-pay | Admitting: Obstetrics & Gynecology

## 2018-08-05 ENCOUNTER — Telehealth: Payer: Self-pay | Admitting: Obstetrics & Gynecology

## 2018-08-05 ENCOUNTER — Encounter: Payer: Self-pay | Admitting: Obstetrics & Gynecology

## 2018-08-05 NOTE — Telephone Encounter (Signed)
Spoke with patient. Her LMP 06-26-18 and she took Femara days 5-9. She hasn't had a cycle this month. She did a home UPT and it was negative. Patient not sure what to now since no cycle. She wants to know if she should take a break from Femara and have her husband tested before going back on Femara. Will discuss further recommendations with Dr.Miller and call patient back.  Routed to provider.

## 2018-08-05 NOTE — Telephone Encounter (Signed)
Order was faxed to Dr. Kerin Perna and she will pick up kit Monday.

## 2018-08-05 NOTE — Telephone Encounter (Signed)
Can take provera 24m x 10 day to start cycle.  Ok to sent to pharmacy.    Can continue Femara with cycle if she desires.  Yes, her husband needs to do the semen analysis now.  Does she have the "kit".  I can leave one at the front for her if she does not.  It [facility] pat[facility] and endometrial cavity.  Should be done days 1-9 of cycle only.  Will need to call with onset of cycle to schedule.  If all of this is normal, then needs to consider referral to REI at that time.  Ok to wait about this decision pending the results of the above tests.  Thanks.

## 2018-08-05 NOTE — Telephone Encounter (Signed)
Spoke with patient and she was given all recommendations below from [place]. Rx for Provera sent to pharmacy on file. Patient will pick up kit for semen analysis on Monday--need order from [place] to fax. She know to call back with onset of cycle to schedule SHGM.  Routed to provider for semen analysis order.

## 2018-08-05 NOTE — Telephone Encounter (Signed)
Patient sent the following message through [place]. Routing to triage to assist patient with request.  Haven't started my period. And also took a test this morning and it was negative .

## 2018-08-08 MED ORDER — MEDROXYPROGESTERONE ACETATE 10 MG PO TABS: 10.0000 mg | ORAL_TABLET | Freq: Every day | ORAL | 0 refills | Status: AC

## 2018-08-08 NOTE — Telephone Encounter (Signed)
Rx declined.  She needs to call after her cycle starts.  She was given Provera at the end of last week for 10 days to start her cycle.  Please ask her to call when bleeding starts. Thanks.

## 2018-08-08 NOTE — Telephone Encounter (Signed)
Medication refill request: Femara Last AEX:  01/26/18 Next AEX: none scheduled at this time  Last MMG (if hormonal medication request): NA Refill authorized: #15 with 0 RF Please advise.

## 2018-08-16 ENCOUNTER — Telehealth: Payer: Self-pay | Admitting: Obstetrics & Gynecology

## 2018-08-16 ENCOUNTER — Encounter: Payer: Self-pay | Admitting: Obstetrics & Gynecology

## 2018-08-16 DIAGNOSIS — N926 Irregular menstruation, unspecified: Secondary | ICD-10-CM

## 2018-08-16 DIAGNOSIS — N970 Female infertility associated with anovulation: Secondary | ICD-10-CM

## 2018-08-16 NOTE — Telephone Encounter (Signed)
Patient is returning call to [place]. Patient updated telephone number at time of call.

## 2018-08-16 NOTE — Telephone Encounter (Signed)
Patient sent the following correspondence through [place]. Routing to triage to assist patient with request.  Started today. I think I want to wait to get my tubes checked once I get my husbands results back .

## 2018-08-16 NOTE — Telephone Encounter (Signed)
Spoke with patient. Confirmed LMP 08/16/18.  Patient declines SHGM at this time due to cost. Patient request to wait until after semen analysis is completed, scheduled for 08/29/18. Advised I will place order for precert of SHGM, our office will return call to review benefits. Patient asking if Dr. Sabra Heck will send in Rx for femara? Advised I will review with Dr. Sabra Heck and advise.   Rx pended for Femara.  Dr. Sabra Heck -please advise.

## 2018-08-16 NOTE — Telephone Encounter (Signed)
MyChart message to patient.   Order pended for [facility].   Dr. Sabra Heck -please confirm diagnosis.

## 2018-08-16 NOTE — Telephone Encounter (Signed)
Call placed to Mobile number [phone], has been changed or disconnected.   Call placed to [phone], female answered and advised wrong number.    MyChart message to patient.   Routing to Dr. Lestine Box.

## 2018-08-16 NOTE — Telephone Encounter (Signed)
It is appropriate to proceed with [facility] and assessment of tubal patency as well with this pt.  She just started her cycle so that could be done on Thursday.  Please send her another message about this please.  Thanks.

## 2018-08-17 MED ORDER — LETROZOLE 2.5 MG PO TABS: ORAL_TABLET | ORAL | 0 refills | Status: AC

## 2018-08-17 NOTE — Telephone Encounter (Signed)
Spoke with patient, advised as seen below per Dr. Sabra Heck. Patient declines to proceed with [facility] at this time, will wait for semen analysis results. Patient expressed that she was unsure why she is waiting until now to do [facility]? Advised patient weight loss, SHGM, semen analysis and referral to REI were discussed at [place] on 02/03/18. Recommended OV with Dr. Sabra Heck if she would like to further discuss plan of care, patient declined. States she will wait for semen analysis results.  Patient verbalizes understanding. Patient thankful for call.   Routing to provider for final review. Patient is agreeable to disposition. Will close encounter.

## 2018-08-17 NOTE — Telephone Encounter (Signed)
Order signed for Femara and SHGM.  This is month #6 of the Femara.  It is time to proceed with additional evaluation and not just Femara so please let her know that I (or any other provider who does fertility management) will need semen analysis and evaluation for tubal patency next before continuing fermara.  Thanks.

## 2018-08-18 ENCOUNTER — Telehealth: Payer: Self-pay | Admitting: Obstetrics & Gynecology

## 2018-08-18 NOTE — Telephone Encounter (Signed)
Call placed to patient to review benefits for a sonohysterogram. Patient understood information provided, but declined to schedule at this time. Patient advises she will call when ready to proceed with scheduling.  Forwarding to Dr Sabra Heck for final review. Patient is agreeable to disposition. Will close encounter  cc: Glorianne Manchester, RN

## 2018-09-24 DIAGNOSIS — G8911 Acute pain due to trauma: Secondary | ICD-10-CM | POA: Diagnosis not present

## 2018-09-24 DIAGNOSIS — E119 Type 2 diabetes mellitus without complications: Secondary | ICD-10-CM | POA: Diagnosis not present

## 2018-09-24 DIAGNOSIS — S8992XA Unspecified injury of left lower leg, initial encounter: Secondary | ICD-10-CM | POA: Diagnosis not present

## 2018-09-24 DIAGNOSIS — M25562 Pain in left knee: Secondary | ICD-10-CM | POA: Diagnosis not present

## 2018-09-24 DIAGNOSIS — Z7951 Long term (current) use of inhaled steroids: Secondary | ICD-10-CM | POA: Diagnosis not present

## 2018-09-24 DIAGNOSIS — Z7984 Long term (current) use of oral hypoglycemic drugs: Secondary | ICD-10-CM | POA: Diagnosis not present

## 2019-03-01 ENCOUNTER — Telehealth: Payer: Self-pay | Admitting: Certified Nurse Midwife

## 2019-03-01 DIAGNOSIS — F419 Anxiety disorder, unspecified: Secondary | ICD-10-CM

## 2019-03-01 NOTE — Telephone Encounter (Signed)
Spoke with patient. Patient reports hx of increasing anxiety. No new stressors or lifestyle changes. Denies SI/HI. Patient states she feels safe. Patient would like to talk with psychiatrist about medications.   Patient declines OV/MyChart vist at this time, due to no insurance. Patient is asking if Dr. Hyacinth Meeker will place a referral to Dr. Hinton Dyer?   Advised patient last OV 02/03/18, will have to review with Dr. Hyacinth Meeker and return call. Patient agreeable.   Dr. Hyacinth Meeker -please review and advise.

## 2019-03-01 NOTE — Telephone Encounter (Signed)
Patient left a message on the answering machine asking for a referral to a psychiatrist.

## 2019-03-02 NOTE — Telephone Encounter (Signed)
Spoke with patient. Patient tried to schedule with [facility] and was advised referral needed. Patient request first available provider.   Order placed for referral. Advised patient our referral coordinator will f/u with appt details once scheduled. Patient verbalizes understanding and is agreeable.   Routing to provider for final review. Patient is agreeable to disposition. Will close encounter.  Cc: Soundra Pilon

## 2019-03-02 NOTE — Telephone Encounter (Signed)
As this is psychiatry, the does not need a referral.  She just needs to call.  If she had and they advised she needs a referral, I'm happy to place it.  Referrals are not typically needed, however.  Thanks.

## 2019-03-09 ENCOUNTER — Ambulatory Visit (INDEPENDENT_AMBULATORY_CARE_PROVIDER_SITE_OTHER): Payer: Self-pay | Admitting: Psychiatry

## 2019-03-09 ENCOUNTER — Encounter (HOSPITAL_COMMUNITY): Payer: Self-pay | Admitting: Psychiatry

## 2019-03-09 VITALS — Ht 67.0 in | Wt 245.0 lb

## 2019-03-09 DIAGNOSIS — F4010 Social phobia, unspecified: Secondary | ICD-10-CM

## 2019-03-09 DIAGNOSIS — F331 Major depressive disorder, recurrent, moderate: Secondary | ICD-10-CM

## 2019-03-09 DIAGNOSIS — F411 Generalized anxiety disorder: Secondary | ICD-10-CM

## 2019-03-09 MED ORDER — FLUOXETINE HCL 10 MG PO CAPS: 10.0000 mg | ORAL_CAPSULE | Freq: Every day | ORAL | 0 refills | Status: DC

## 2019-03-09 NOTE — Progress Notes (Signed)
Psychiatric Initial Adult Assessment   Patient Identification: Brenda Bauer MRN:  [id number] Date of Evaluation:  03/09/2019 Referral Source: OB/ Provider Chief Complaint:  anxiety Visit Diagnosis:    ICD-10-CM   1. MDD (major depressive disorder), recurrent episode, moderate (HCC)  F33.1   2. GAD (generalized anxiety disorder)  F41.1   3. Social anxiety disorder  F40.10    I connected with FRANKIE ZITO on 03/09/19 at  9:00 AM EST by a video enabled telemedicine application and verified that I am speaking with the correct person using two identifiers.   I discussed the limitations of evaluation and management by telemedicine and the availability of in person appointments. The patient expressed understanding and agreed to proceed.   History of Present Illness: Patient is a 29 years old currently married female referred by provider for management of anxiety. She works as a Midwife.  Patient has noticed the last couple of months she is having anxiety when she goes out she worries about extreme things like her house may go on fire.  She gets overwhelmed she worries excessively she has anxiety when she is around people as a people looking at her no paranoia or hearing voices but she feels uncomfortable unless she is in her bubble or safety zone  Denies psychotic symptoms denies having manic-like symptoms she does have panic-like symptoms on people only if she has to go around people, she worries and dwells on it. She does endorse feeling some abdominal pain times months.  She does not endorse hopelessness and suicidal thoughts she had spent a lot of time trying to but it was very difficult she did feel better when she left him in at age 49 and started living with grandmother She is noticing the last 1 year her anxiety is getting overwhelmed and she started talking with her cousin who wanted her to get help she thought not worrying about things would make her more anxious so she did not  want to make an appointment in the beginning  Aggravating factor: difficult childhood with dad,  Modifying factor: husband, animals Duration more then few years Severity worse last few months  Drug use denies Psych admission denies No suicidal toughts or admission      Past Psychiatric History: anxiety   Previous Psychotropic Medications: No   Substance Abuse History in the last 12 months:  No.  Consequences of Substance Abuse: NA  Past Medical History:  Past Medical History:  Diagnosis Date  . Anxiety   . Asthma   . Asthma     Past Surgical History:  Procedure Laterality Date  . WISDOM TOOTH EXTRACTION      Family Psychiatric History: dad : depression , anxieyt  Family History:  Family History  Problem Relation Age of Onset  . Diabetes Father   . Heart disease Father   . Cancer Paternal Grandfather     Social History:   Social History   Socioeconomic History  . Marital status: Married    Spouse name: Not on file  . Number of children: Not on file  . Years of education: Not on file  . Highest education level: Not on file  Occupational History  . Not on file  Tobacco Use  . Smoking status: Former Smoker    Types: Cigarettes  . Smokeless tobacco: Never Used  Substance and Sexual Activity  . Alcohol use: No  . Drug use: No  . Sexual activity: Yes    Partners: Male  Birth control/protection: Pill, None  Other Topics Concern  . Not on file  Social History Narrative  . Not on file   Social Determinants of Health   Financial Resource Strain:   . Difficulty of Paying Living Expenses: Not on file  Food Insecurity:   . Worried About Charity fundraiser in the Last Year: Not on file  . Ran Out of Food in the Last Year: Not on file  Transportation Needs:   . Lack of Transportation (Medical): Not on file  . Lack of Transportation (Non-Medical): Not on file  Physical Activity:   . Days of Exercise per Week: Not on file  . Minutes of Exercise per  Session: Not on file  Stress:   . Feeling of Stress : Not on file  Social Connections:   . Frequency of Communication with Friends and Family: Not on file  . Frequency of Social Gatherings with Friends and Family: Not on file  . Attends Religious Services: Not on file  . Active Member of Clubs or Organizations: Not on file  . Attends Archivist Meetings: Not on file  . Marital Status: Not on file    Additional Social History: grew up with dad and difficult to please him, did not wanted to talk today of childhood and became tearful. No prior help for anxiety  Allergies:  No Known Allergies  Metabolic Disorder Labs: Lab Results  Component Value Date   HGBA1C 5.7 (H) 01/26/2018   Lab Results  Component Value Date   PROLACTIN 8.4 01/26/2018   No results found for: CHOL, TRIG, HDL, CHOLHDL, VLDL, LDLCALC Lab Results  Component Value Date   TSH 1.450 01/26/2018    Therapeutic Level Labs: No results found for: LITHIUM No results found for: CBMZ No results found for: VALPROATE  Current Medications: Current Outpatient Medications  Medication Sig Dispense Refill  . albuterol (PROVENTIL HFA;VENTOLIN HFA) 108 (90 BASE) MCG/ACT inhaler Inhale 1-2 puffs into the lungs every 6 (six) hours as needed for wheezing or shortness of breath. 1 Inhaler 0  . FLUoxetine (PROZAC) 10 MG capsule Take 1 capsule (10 mg total) by mouth daily. 30 capsule 0  . letrozole (FEMARA) 2.5 MG tablet 7.5mg (3 tabs) daily on days 5-9 of cycle. 15 tablet 0  . medroxyPROGESTERone (PROVERA) 10 MG tablet Take 1 tablet (10 mg total) by mouth daily. 10 tablet 0  . metFORMIN (GLUCOPHAGE) 500 MG tablet TAKE 2 TABLETS BY MOUTH TWICE A DAY 360 tablet 0   No current facility-administered medications for this visit.      Psychiatric Specialty Exam: Review of Systems  There were no vitals taken for this visit.There is no height or weight on file to calculate BMI.  General Appearance: Casual  Eye Contact:   Fair  Speech:  Slow  Volume:  Decreased  Mood:  Dysphoric  Affect:  Congruent  Thought Process:  Goal Directed  Orientation:  Full (Time, Place, and Person)  Thought Content:  Rumination  Suicidal Thoughts:  No  Homicidal Thoughts:  No  Memory:  Immediate;   Fair Recent;   Fair  Judgement:  Fair  Insight:  Shallow  Psychomotor Activity:  Normal  Concentration:  Concentration: Fair and Attention Span: Fair  Recall:  [employer] of Knowledge:Good  Language: Good  Akathisia:  No  Handed:   AIMS (if indicated):  not done  Assets:  Desire for Improvement Financial Resources/Insurance Physical Health Social Support  ADL's:  Intact  Cognition: WNL  Sleep:  Fair   Screenings:   Assessment and Plan: as follows MDD moderate: start prozac 10mg , will keep dose low till 1-2 weeks or next visit Discussed it takes time to build up. Refer to therapy to work on anxiety and self esteem  GAD with panic: start prozac as above, refer to therapy  Social anxiety: start prozac and therapy  Need to explore childhood later or in therapy as may have contributed to anxiety and panic around people  Work on Worry time and distract after to not dwell on same worries  Provided supportive therapy, patient agrees to call early if needed  Fu 2-3w and to call in for therapy   I discussed the assessment and treatment plan with the patient. The patient was provided an opportunity to ask questions and all were answered. The patient agreed with the plan and demonstrated an understanding of the instructions.   The patient was advised to call back or seek an in-person evaluation if the symptoms worsen or if the condition fails to improve as anticipated.  I provided 45 minutes of non-face-to-face time during this encounter.  , MD 2/18/20219:18 AM

## 2019-03-31 ENCOUNTER — Other Ambulatory Visit (HOSPITAL_COMMUNITY): Payer: Self-pay | Admitting: Psychiatry

## 2019-04-03 ENCOUNTER — Encounter: Payer: Self-pay | Admitting: Certified Nurse Midwife

## 2019-04-05 ENCOUNTER — Encounter: Payer: Self-pay | Admitting: Certified Nurse Midwife

## 2019-04-06 ENCOUNTER — Encounter (HOSPITAL_COMMUNITY): Payer: Self-pay | Admitting: Psychiatry

## 2019-04-06 ENCOUNTER — Ambulatory Visit (INDEPENDENT_AMBULATORY_CARE_PROVIDER_SITE_OTHER): Payer: Self-pay | Admitting: Psychiatry

## 2019-04-06 DIAGNOSIS — F4010 Social phobia, unspecified: Secondary | ICD-10-CM

## 2019-04-06 DIAGNOSIS — F331 Major depressive disorder, recurrent, moderate: Secondary | ICD-10-CM

## 2019-04-06 DIAGNOSIS — F411 Generalized anxiety disorder: Secondary | ICD-10-CM

## 2019-04-06 MED ORDER — FLUOXETINE HCL 20 MG PO CAPS: 20.0000 mg | ORAL_CAPSULE | Freq: Every day | ORAL | 1 refills | Status: DC

## 2019-04-06 NOTE — Progress Notes (Signed)
[facility] Follow up visit  Patient Identification: Brenda Bauer MRN:  [id number] Date of Evaluation:  04/06/2019 Referral Source: OB/ Provider Chief Complaint:   anxiety follow up  Visit Diagnosis:    ICD-10-CM   1. MDD (major depressive disorder), recurrent episode, moderate (HCC)  F33.1   2. GAD (generalized anxiety disorder)  F41.1   3. Social anxiety disorder  F40.10     I connected with KATLEN SEYER on 04/06/19 at  8:30 AM EDT by telephone and verified that I am speaking with the correct person using two identifiers.  I discussed the limitations of evaluation and management by telemedicine and the availability of in person appointments. The patient expressed understanding and agreed to proceed.   History of Present Illness: Patient is a 29 years old currently married female initially referred by provider for management of anxiety. She works as a Human resources officer.  History of anxiety and thinking what if house gets on fire or other things.  prozac 10mg  started last visit, some improvement but still has worries and dwells on it Has therapy scheduled for next week No side effects Depression some better, still worries being around people or anticipation anxiety   Aggravating factor: difficult childhood with dad Modifying factor: husband, animals Duration more then few years Severity worse last few months  Drug use denies Psych admission denies No suicidal toughts or admission      Past Psychiatric History: anxiety    Past Medical History:  Past Medical History:  Diagnosis Date  . Anxiety   . Asthma   . Asthma     Past Surgical History:  Procedure Laterality Date  . WISDOM TOOTH EXTRACTION      Family Psychiatric History: dad : depression , anxieyt  Family History:  Family History  Problem Relation Age of Onset  . Diabetes Father   . Heart disease Father   . Cancer Paternal Grandfather     Social History:   Social History   Socioeconomic History  .  Marital status: Married    Spouse name: Not on file  . Number of children: Not on file  . Years of education: Not on file  . Highest education level: Not on file  Occupational History  . Not on file  Tobacco Use  . Smoking status: Former Smoker    Types: Cigarettes  . Smokeless tobacco: Never Used  Substance and Sexual Activity  . Alcohol use: No  . Drug use: No  . Sexual activity: Yes    Partners: Male    Birth control/protection: Pill, None  Other Topics Concern  . Not on file  Social History Narrative  . Not on file   Social Determinants of Health   Financial Resource Strain:   . Difficulty of Paying Living Expenses:   Food Insecurity:   . Worried About Charity fundraiser in the Last Year:   . Arboriculturist in the Last Year:   Transportation Needs:   . Film/video editor (Medical):   Marland Kitchen Lack of Transportation (Non-Medical):   Physical Activity:   . Days of Exercise per Week:   . Minutes of Exercise per Session:   Stress:   . Feeling of Stress :   Social Connections:   . Frequency of Communication with Friends and Family:   . Frequency of Social Gatherings with Friends and Family:   . Attends Religious Services:   . Active Member of Clubs or Organizations:   . Attends Club or  Organization Meetings:   Marland Kitchen Marital Status:       Allergies:  No Known Allergies  Metabolic Disorder Labs: Lab Results  Component Value Date   HGBA1C 5.7 (H) 01/26/2018   Lab Results  Component Value Date   PROLACTIN 8.4 01/26/2018   No results found for: CHOL, TRIG, HDL, CHOLHDL, VLDL, LDLCALC Lab Results  Component Value Date   TSH 1.450 01/26/2018    Therapeutic Level Labs: No results found for: LITHIUM No results found for: CBMZ No results found for: VALPROATE  Current Medications: Current Outpatient Medications  Medication Sig Dispense Refill  . albuterol (PROVENTIL HFA;VENTOLIN HFA) 108 (90 BASE) MCG/ACT inhaler Inhale 1-2 puffs into the lungs every 6 (six)  hours as needed for wheezing or shortness of breath. 1 Inhaler 0  . FLUoxetine (PROZAC) 20 MG capsule Take 1 capsule (20 mg total) by mouth daily. 30 capsule 1  . letrozole (FEMARA) 2.5 MG tablet 7.5mg (3 tabs) daily on days 5-9 of cycle. 15 tablet 0  . medroxyPROGESTERone (PROVERA) 10 MG tablet Take 1 tablet (10 mg total) by mouth daily. 10 tablet 0  . metFORMIN (GLUCOPHAGE) 500 MG tablet TAKE 2 TABLETS BY MOUTH TWICE A DAY 360 tablet 0   No current facility-administered medications for this visit.      Psychiatric Specialty Exam: Review of Systems  Psychiatric/Behavioral: The patient is nervous/anxious.     There were no vitals taken for this visit.There is no height or weight on file to calculate BMI.  General Appearance:   Eye Contact:   Speech:  Slow  Volume:  Decreased  Mood:  Somewhat anxious  Affect:  Congruent  Thought Process:  Goal Directed  Orientation:  Full (Time, Place, and Person)  Thought Content:  Rumination  Suicidal Thoughts:  No  Homicidal Thoughts:  No  Memory:  Immediate;   Fair Recent;   Fair  Judgement:  Fair  Insight:  Shallow  Psychomotor Activity:  Normal  Concentration:  Concentration: Fair and Attention Span: Fair  Recall:  [employer] of Knowledge:Good  Language: Good  Akathisia:  No  Handed:   AIMS (if indicated):  not done  Assets:  Desire for Improvement Financial Resources/Insurance Physical Health Social Support  ADL's:  Intact  Cognition: WNL  Sleep:  Fair   Screenings:   Assessment and Plan: as follows MDD moderate: somewhat subdued, increase prozac to 20mg   Discussed it takes time to build up. Refer to therapy to work on anxiety and self esteem  GAD with panic:endorses anxiety, increase prozac to 20mg    Social anxiety: treatement as above  Need to explore childhood later or in therapy as may have contributed to anxiety and panic around people  Work on Worry time and distract after to not dwell on same worries  Provided  supportive therapy, patient agrees to call early if needed  Fu 2-3w and to call in for therapy   I discussed the assessment and treatment plan with the patient. The patient was provided an opportunity to ask questions and all were answered. The patient agreed with the plan and demonstrated an understanding of the instructions.   The patient was advised to call back or seek an in-person evaluation if the symptoms worsen or if the condition fails to improve as anticipated.  I provided 15 minutes of non-face-to-face time during this encounter. Fu 4w  , MD 3/18/20218:37 AM

## 2019-04-17 ENCOUNTER — Other Ambulatory Visit (HOSPITAL_COMMUNITY): Payer: Self-pay

## 2019-04-17 MED ORDER — FLUOXETINE HCL 20 MG PO CAPS: 20.0000 mg | ORAL_CAPSULE | Freq: Every day | ORAL | 0 refills | Status: DC

## 2019-04-24 ENCOUNTER — Other Ambulatory Visit: Payer: Self-pay | Admitting: Obstetrics and Gynecology

## 2019-04-25 NOTE — Telephone Encounter (Signed)
Medication refill request: Metformin  Last AEX:  01/26/2018 Next AEX: not yet scheduled  Last MMG (if hormonal medication request): n/a Refill authorized: #[id number], [id number]  Please advise and refill if appropriate.

## 2019-04-26 ENCOUNTER — Ambulatory Visit (INDEPENDENT_AMBULATORY_CARE_PROVIDER_SITE_OTHER): Payer: Self-pay | Admitting: Licensed Clinical Social Worker

## 2019-04-26 DIAGNOSIS — F331 Major depressive disorder, recurrent, moderate: Secondary | ICD-10-CM

## 2019-04-26 DIAGNOSIS — F411 Generalized anxiety disorder: Secondary | ICD-10-CM

## 2019-04-26 DIAGNOSIS — F4010 Social phobia, unspecified: Secondary | ICD-10-CM
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
## 2019-04-26 NOTE — Telephone Encounter (Signed)
Please call pt.  We have not done a RF for this since 04/2018 (without RFs) according to medications records.  Not sure if receiving from another provider.  Needs AEX.  If scheduled, will RF.  If getting from another provider, Brenda Bauer should ask pharmacy to forward to that provider.

## 2019-05-02 ENCOUNTER — Ambulatory Visit (INDEPENDENT_AMBULATORY_CARE_PROVIDER_SITE_OTHER): Payer: Self-pay | Admitting: Psychiatry

## 2019-05-02 ENCOUNTER — Encounter (HOSPITAL_COMMUNITY): Payer: Self-pay | Admitting: Psychiatry

## 2019-05-02 DIAGNOSIS — F411 Generalized anxiety disorder: Secondary | ICD-10-CM

## 2019-05-02 DIAGNOSIS — F4010 Social phobia, unspecified: Secondary | ICD-10-CM

## 2019-05-02 DIAGNOSIS — F331 Major depressive disorder, recurrent, moderate: Secondary | ICD-10-CM

## 2019-05-02 MED ORDER — ESCITALOPRAM OXALATE 10 MG PO TABS: 10.0000 mg | ORAL_TABLET | Freq: Every day | ORAL | 0 refills | Status: DC
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
## 2019-05-04 NOTE — Telephone Encounter (Signed)
Spoke with patient and she states new Ins.begins middle of May. She states no other provider Rx Metformin and just restarted.  Made appointment for AEX with Dr.Miller 06-12-19 3:00pm. Routed to provider to refill Metformin.

## 2019-05-24 ENCOUNTER — Ambulatory Visit (HOSPITAL_COMMUNITY): Payer: Self-pay | Admitting: Licensed Clinical Social Worker

## 2019-05-30 NOTE — Progress Notes (Deleted)
29 y.o. G0P0000 Married White or Caucasian female here for annual exam.    No LMP recorded. (Menstrual status: Irregular Periods).          Sexually active: {yes no:314532}  The current method of family planning is none.    Exercising: {yes no:314532}  {types:19826} Smoker:  {YES NO:22349}  Health Maintenance: Pap:  01-26-2018 neg HPV HR neg History of abnormal Pap:  no MMG:  none Colonoscopy:  none BMD:   none TDaP:  2013 Pneumonia vaccine(s):  no Shingrix:   no Hep C testing: neg per patient Screening Labs: ***   reports that she has quit smoking. Her smoking use included cigarettes. She has never used smokeless tobacco. She reports that she does not drink alcohol or use drugs.  Past Medical History:  Diagnosis Date  . Anxiety   . Asthma   . Asthma     Past Surgical History:  Procedure Laterality Date  . WISDOM TOOTH EXTRACTION      Current Outpatient Medications  Medication Sig Dispense Refill  . albuterol (PROVENTIL HFA;VENTOLIN HFA) 108 (90 BASE) MCG/ACT inhaler Inhale 1-2 puffs into the lungs every 6 (six) hours as needed for wheezing or shortness of breath. 1 Inhaler 0  . escitalopram (LEXAPRO) 10 MG tablet Take 1 tablet (10 mg total) by mouth daily. 30 tablet 0  . letrozole (FEMARA) 2.5 MG tablet 7.5mg (3 tabs) daily on days 5-9 of cycle. 15 tablet 0  . medroxyPROGESTERone (PROVERA) 10 MG tablet Take 1 tablet (10 mg total) by mouth daily. 10 tablet 0  . metFORMIN (GLUCOPHAGE) 500 MG tablet TAKE 2 TABLETS BY MOUTH TWICE A DAY 360 tablet 0   No current facility-administered medications for this visit.    Family History  Problem Relation Age of Onset  . Diabetes Father   . Heart disease Father   . Cancer Paternal Grandfather     Review of Systems  Exam:   There were no vitals taken for this visit.     General appearance: alert, cooperative and appears stated age Head: Normocephalic, without obvious abnormality, atraumatic Neck: no adenopathy, supple,  symmetrical, trachea midline and thyroid {EXAM; THYROID:18604} Lungs: clear to auscultation bilaterally Breasts: {Exam; breast:13139::"normal appearance, no masses or tenderness"} Heart: regular rate and rhythm Abdomen: soft, non-tender; bowel sounds normal; no masses,  no organomegaly Extremities: extremities normal, atraumatic, no cyanosis or edema Skin: Skin color, texture, turgor normal. No rashes or lesions Lymph nodes: Cervical, supraclavicular, and axillary nodes normal. No abnormal inguinal nodes palpated Neurologic: Grossly normal   Pelvic: External genitalia:  no lesions              Urethra:  normal appearing urethra with no masses, tenderness or lesions              Bartholins and Skenes: normal                 Vagina: normal appearing vagina with normal color and discharge, no lesions              Cervix: {exam; cervix:14595}              Pap taken: {yes no:314532} Bimanual Exam:  Uterus:  {exam; uterus:12215}              Adnexa: {exam; adnexa:12223}               Rectovaginal: Confirms               Anus:  normal sphincter tone,  no lesions  Chaperone, ***Terence Lux, CMA, was present for exam.  A:  Well Woman with normal exam  P:   {plan; gyn:5269::"mammogram","pap smear","return annually or prn"}

## 2019-06-12 ENCOUNTER — Ambulatory Visit: Payer: BLUE CROSS/BLUE SHIELD | Admitting: Obstetrics & Gynecology

## 2019-06-12 ENCOUNTER — Encounter: Payer: Self-pay | Admitting: Obstetrics & Gynecology

## 2019-07-03 ENCOUNTER — Telehealth (HOSPITAL_COMMUNITY): Payer: Self-pay | Admitting: Psychiatry

## 2019-07-07 ENCOUNTER — Other Ambulatory Visit (HOSPITAL_COMMUNITY): Payer: Self-pay | Admitting: Psychiatry

## 2020-05-08 ENCOUNTER — Telehealth (HOSPITAL_COMMUNITY): Payer: Self-pay | Admitting: Psychiatry

## 2020-05-08 ENCOUNTER — Encounter (HOSPITAL_COMMUNITY): Payer: Self-pay | Admitting: Psychiatry

## 2020-05-08 ENCOUNTER — Telehealth (INDEPENDENT_AMBULATORY_CARE_PROVIDER_SITE_OTHER): Payer: Self-pay | Admitting: Psychiatry

## 2020-05-08 DIAGNOSIS — F331 Major depressive disorder, recurrent, moderate: Secondary | ICD-10-CM

## 2020-05-08 DIAGNOSIS — F411 Generalized anxiety disorder: Secondary | ICD-10-CM

## 2020-05-08 MED ORDER — ESCITALOPRAM OXALATE 10 MG PO TABS: 1.0000 | ORAL_TABLET | Freq: Every day | ORAL | 0 refills | Status: DC

## 2020-05-08 NOTE — Progress Notes (Signed)
[facility] Follow up visit  Patient Identification: Brenda Bauer MRN:  [id number] Date of Evaluation:  05/08/2020 Referral Source: OB/ Provider Chief Complaint:   anxiety follow up  Visit Diagnosis:    ICD-10-CM   1. MDD (major depressive disorder), recurrent episode, moderate (HCC)  F33.1   2. GAD (generalized anxiety disorder)  F41.1    Virtual Visit via Video Note  I connected with Lorine Bears on 05/08/20 at  2:15 PM EDT by a video enabled telemedicine application and verified that I am speaking with the correct person using two identifiers.  Location: Patient: home Provider: home office   I discussed the limitations of evaluation and management by telemedicine and the availability of in person appointments. The patient expressed understanding and agreed to proceed.      I discussed the assessment and treatment plan with the patient. The patient was provided an opportunity to ask questions and all were answered. The patient agreed with the plan and demonstrated an understanding of the instructions.   The patient was advised to call back or seek an in-person evaluation if the symptoms worsen or if the condition fails to improve as anticipated.  I provided  10  minutes of non-face-to-face time during this encounter.    History of Present Illness: Patient is a 30 years old currently married female initially referred by provider for management of anxiety. She works as a Midwife.   Patient last seen 1 year ago states that her husband was not working and she ran out of insurance.  She wants to get back on Lexapro if that help. Overall medication helps and when she is off of it she does feel down  Not endorsing excessive anxiety when on medication     Aggravating factor: difficult childhood with dad Modifying factor: husband, animals Duration since 2017 Severity same Drug use denies Psych admission denies No suicidal toughts or admission      Past Psychiatric  History: anxiety    Past Medical History:  Past Medical History:  Diagnosis Date  . Anxiety   . Asthma   . Asthma     Past Surgical History:  Procedure Laterality Date  . WISDOM TOOTH EXTRACTION      Family Psychiatric History: dad : depression , anxieyt  Family History:  Family History  Problem Relation Age of Onset  . Diabetes Father   . Heart disease Father   . Cancer Paternal Grandfather     Social History:   Social History   Socioeconomic History  . Marital status: Married    Spouse name: Not on file  . Number of children: Not on file  . Years of education: Not on file  . Highest education level: Not on file  Occupational History  . Not on file  Tobacco Use  . Smoking status: Former Smoker    Types: Cigarettes  . Smokeless tobacco: Never Used  Substance and Sexual Activity  . Alcohol use: No  . Drug use: No  . Sexual activity: Yes    Partners: Male    Birth control/protection: Pill, None  Other Topics Concern  . Not on file  Social History Narrative  . Not on file   Social Determinants of Health   Financial Resource Strain: Not on file  Food Insecurity: Not on file  Transportation Needs: Not on file  Physical Activity: Not on file  Stress: Not on file  Social Connections: Not on file      Allergies:  No  Known Allergies  Metabolic Disorder Labs: Lab Results  Component Value Date   HGBA1C 5.7 (H) 01/26/2018   Lab Results  Component Value Date   PROLACTIN 8.4 01/26/2018   No results found for: CHOL, TRIG, HDL, CHOLHDL, VLDL, LDLCALC Lab Results  Component Value Date   TSH 1.450 01/26/2018    Therapeutic Level Labs: No results found for: LITHIUM No results found for: CBMZ No results found for: VALPROATE  Current Medications: Current Outpatient Medications  Medication Sig Dispense Refill  . albuterol (PROVENTIL HFA;VENTOLIN HFA) 108 (90 BASE) MCG/ACT inhaler Inhale 1-2 puffs into the lungs every 6 (six) hours as needed for  wheezing or shortness of breath. 1 Inhaler 0  . escitalopram (LEXAPRO) 10 MG tablet Take 1 tablet (10 mg total) by mouth daily. 90 tablet 0  . letrozole (FEMARA) 2.5 MG tablet 7.5mg (3 tabs) daily on days 5-9 of cycle. 15 tablet 0  . medroxyPROGESTERone (PROVERA) 10 MG tablet Take 1 tablet (10 mg total) by mouth daily. 10 tablet 0  . metFORMIN (GLUCOPHAGE) 500 MG tablet TAKE 2 TABLETS BY MOUTH TWICE A DAY 360 tablet 0   No current facility-administered medications for this visit.      Psychiatric Specialty Exam: Review of Systems  Cardiovascular: Negative for chest pain.  Psychiatric/Behavioral: Negative for agitation.    There were no vitals taken for this visit.There is no height or weight on file to calculate BMI.  General Appearance: Casual  Eye Contact: Fair  Speech:  Slow  Volume:  Decreased  Mood:  Somewhat stressed  Affect:  Congruent  Thought Process:  Goal Directed  Orientation:  Full (Time, Place, and Person)  Thought Content:  Rumination  Suicidal Thoughts:  No  Homicidal Thoughts:  No  Memory:  Immediate;   Fair Recent;   Fair  Judgement:  Fair  Insight:  Shallow  Psychomotor Activity:  Normal  Concentration:  Concentration: Fair and Attention Span: Fair  Recall:  [employer] of Knowledge:Good  Language: Good  Akathisia:  No  Handed:   AIMS (if indicated):  not done  Assets:  Desire for Improvement Financial Resources/Insurance Physical Health Social Support  ADL's:  Intact  Cognition: WNL  Sleep:  Fair   Screenings: Flowsheet Row Video Visit from 05/08/2020 in [facility] [place]  C-SSRS RISK CATEGORY No Risk      Assessment and Plan: as follows MDD moderate: Somewhat subdued we will restart Lexapro increase to 10 mg in 3 days GAD with panic: Anxiety fluctuates restart Lexapro she does better when on medication consider therapy if needed Social anxiety: work in therapy and start lexapro Follow-up in 3 months or  earlier if needed Thresa Ross, MD 4/20/20222:36 PM

## 2020-05-08 NOTE — Telephone Encounter (Signed)
Pt states she tried to get the lexapro transferred to [facility] because it will be cheaper since she does not have insurance. [facility] is refusing to transfer the rx.  Can we please cx that rx and resend it to   [facility] on [address] in [place].

## 2020-05-09 MED ORDER — ESCITALOPRAM OXALATE 10 MG PO TABS: 1.0000 | ORAL_TABLET | Freq: Every day | ORAL | 0 refills | Status: DC

## 2020-05-09 NOTE — Telephone Encounter (Signed)
Done

## 2020-05-09 NOTE — Telephone Encounter (Signed)
Ok sent to [facility] please call [facility] and cancel the one sent there

## 2020-08-07 ENCOUNTER — Telehealth (INDEPENDENT_AMBULATORY_CARE_PROVIDER_SITE_OTHER): Payer: Self-pay | Admitting: Psychiatry

## 2020-08-07 DIAGNOSIS — F331 Major depressive disorder, recurrent, moderate: Secondary | ICD-10-CM

## 2020-08-07 DIAGNOSIS — F411 Generalized anxiety disorder: Secondary | ICD-10-CM

## 2020-08-07 DIAGNOSIS — F4010 Social phobia, unspecified: Secondary | ICD-10-CM

## 2020-08-07 MED ORDER — ESCITALOPRAM OXALATE 10 MG PO TABS: 10.0000 mg | ORAL_TABLET | Freq: Every day | ORAL | 0 refills | Status: DC

## 2020-08-07 NOTE — Progress Notes (Signed)
[facility] Follow up visit  Patient Identification: Brenda Bauer MRN:  [id number] Date of Evaluation:  08/07/2020 Referral Source: OB/ Provider Chief Complaint:   anxiety follow up  Visit Diagnosis:    ICD-10-CM   1. MDD (major depressive disorder), recurrent episode, moderate (HCC)  F33.1     2. GAD (generalized anxiety disorder)  F41.1     3. Social anxiety disorder  F40.10      Virtual Visit via Telephone Note  I connected with Lorine Bears on 08/07/20 at 11:15 AM EDT by telephone and verified that I am speaking with the correct person using two identifiers.  Location: Patient: home Provider: home office   I discussed the limitations, risks, security and privacy concerns of performing an evaluation and management service by telephone and the availability of in person appointments. I also discussed with the patient that there may be a patient responsible charge related to this service. The patient expressed understanding and agreed to proceed.   I discussed the assessment and treatment plan with the patient. The patient was provided an opportunity to ask questions and all were answered. The patient agreed with the plan and demonstrated an understanding of the instructions.   The patient was advised to call back or seek an in-person evaluation if the symptoms worsen or if the condition fails to improve as anticipated.  I provided 11 minutes of non-face-to-face time during this encounter.   History of Present Illness: Patient is a 30 years old currently married female initially referred by provider for management of anxiety. She works as a Midwife.  She is doing fairly well on Lexapro now 10 mg but apparently she has been taking 5 mg and feels better on that dose as well denies anxiety symptoms stress level is low remains positive    Aggravating factor: difficult childhood with dad Modifying factor: husband, animals Duration since 2017  No suicidal toughts or  admission      Past Psychiatric History: anxiety    Past Medical History:  Past Medical History:  Diagnosis Date   Anxiety    Asthma    Asthma     Past Surgical History:  Procedure Laterality Date   WISDOM TOOTH EXTRACTION      Family Psychiatric History: dad : depression , anxieyt  Family History:  Family History  Problem Relation Age of Onset   Diabetes Father    Heart disease Father    Cancer Paternal Grandfather     Social History:   Social History   Socioeconomic History   Marital status: Married    Spouse name: Not on file   Number of children: Not on file   Years of education: Not on file   Highest education level: Not on file  Occupational History   Not on file  Tobacco Use   Smoking status: Former    Types: Cigarettes   Smokeless tobacco: Never  Substance and Sexual Activity   Alcohol use: No   Drug use: No   Sexual activity: Yes    Partners: Male    Birth control/protection: Pill, None  Other Topics Concern   Not on file  Social History Narrative   Not on file   Social Determinants of Health   Financial Resource Strain: Not on file  Food Insecurity: Not on file  Transportation Needs: Not on file  Physical Activity: Not on file  Stress: Not on file  Social Connections: Not on file      Allergies:  No Known Allergies  Metabolic Disorder Labs: Lab Results  Component Value Date   HGBA1C 5.7 (H) 01/26/2018   Lab Results  Component Value Date   PROLACTIN 8.4 01/26/2018   No results found for: CHOL, TRIG, HDL, CHOLHDL, VLDL, LDLCALC Lab Results  Component Value Date   TSH 1.450 01/26/2018    Therapeutic Level Labs: No results found for: LITHIUM No results found for: CBMZ No results found for: VALPROATE  Current Medications: Current Outpatient Medications  Medication Sig Dispense Refill   albuterol (PROVENTIL HFA;VENTOLIN HFA) 108 (90 BASE) MCG/ACT inhaler Inhale 1-2 puffs into the lungs every 6 (six) hours as needed for  wheezing or shortness of breath. 1 Inhaler 0   escitalopram (LEXAPRO) 10 MG tablet Take 1 tablet (10 mg total) by mouth daily. 90 tablet 0   letrozole (FEMARA) 2.5 MG tablet 7.5mg (3 tabs) daily on days 5-9 of cycle. 15 tablet 0   medroxyPROGESTERone (PROVERA) 10 MG tablet Take 1 tablet (10 mg total) by mouth daily. 10 tablet 0   metFORMIN (GLUCOPHAGE) 500 MG tablet TAKE 2 TABLETS BY MOUTH TWICE A DAY 360 tablet 0   No current facility-administered medications for this visit.      Psychiatric Specialty Exam: Review of Systems  Cardiovascular:  Negative for chest pain.  Psychiatric/Behavioral:  Negative for agitation.    There were no vitals taken for this visit.There is no height or weight on file to calculate BMI.  General Appearance:   Eye Contact:   Speech:  Slow  Volume:  Decreased  Mood: Stable  Affect:  Congruent  Thought Process:  Goal Directed  Orientation:  Full (Time, Place, and Person)  Thought Content:  Rumination  Suicidal Thoughts:  No  Homicidal Thoughts:  No  Memory:  Immediate;   Fair Recent;   Fair  Judgement:  Fair  Insight:  Shallow  Psychomotor Activity:  Normal  Concentration:  Concentration: Fair and Attention Span: Fair  Recall:  [employer] of Knowledge:Good  Language: Good  Akathisia:  No  Handed:   AIMS (if indicated):  not done  Assets:  Desire for Improvement Financial Resources/Insurance Physical Health Social Support  ADL's:  Intact  Cognition: WNL  Sleep:  Fair   Screenings: Flowsheet Row Video Visit from 08/07/2020 in [facility] [place] Video Visit from 05/08/2020 in [facility] [place]  C-SSRS RISK CATEGORY No Risk No Risk       Assessment and Plan: as follows Prior documentation reviewed MDD moderate: Doing reasonable continue Lexapro she is taking half of the 10 mg  GAD with panic: Well managed on current medication dose she is taking half of the 10 mg  Follow-up  in 6 months or earlier if needed Thresa Ross, MD 7/20/202211:25 AM

## 2021-02-03 ENCOUNTER — Telehealth (INDEPENDENT_AMBULATORY_CARE_PROVIDER_SITE_OTHER): Payer: BC Managed Care – PPO | Admitting: Psychiatry

## 2021-02-03 ENCOUNTER — Encounter (HOSPITAL_COMMUNITY): Payer: Self-pay | Admitting: Psychiatry

## 2021-02-03 DIAGNOSIS — F331 Major depressive disorder, recurrent, moderate: Secondary | ICD-10-CM

## 2021-02-03 DIAGNOSIS — F4010 Social phobia, unspecified: Secondary | ICD-10-CM

## 2021-02-03 DIAGNOSIS — F411 Generalized anxiety disorder: Secondary | ICD-10-CM

## 2021-02-03 NOTE — Progress Notes (Signed)
[facility] Follow up visit  Patient Identification: Brenda Bauer MRN:  [id number] Date of Evaluation:  02/03/2021 Referral Source: OB/ Provider Chief Complaint:   anxiety, depression  follow up  Visit Diagnosis:    ICD-10-CM   1. MDD (major depressive disorder), recurrent episode, moderate (HCC)  F33.1     2. GAD (generalized anxiety disorder)  F41.1     3. Social anxiety disorder  F40.10       Virtual Visit via Video Note  I connected with ARYAL JACOBOWITZ on 02/03/21 at  3:00 PM EST by a video enabled telemedicine application and verified that I am speaking with the correct person using two identifiers.  Location: Patient: parked car Provider: home office   I discussed the limitations of evaluation and management by telemedicine and the availability of in person appointments. The patient expressed understanding and agreed to proceed.  History of Present Illness:    Observations/Objective:   Assessment and Plan:   Follow Up Instructions:    I discussed the assessment and treatment plan with the patient. The patient was provided an opportunity to ask questions and all were answered. The patient agreed with the plan and demonstrated an understanding of the instructions.   The patient was advised to call back or seek an in-person evaluation if the symptoms worsen or if the condition fails to improve as anticipated.  I provided 15 minutes of non-face-to-face time during this encounter.    History of Present Illness: Patient is a 31 years old currently married female initially referred by provider for management of anxiety. She works as a Human resources officer.  Doing fair on lexapro, stopped last year restarted in January, taking half of lexparo, helping with anxiety and social anxiety Says gets distracted and cant focus or zones off if monotonous environment  Discussed adhd, meds, possibilities but she prefers not to take stimulant as it can cause further anxiety as  well.  Aggravating factor: difficult childhood with dad Modifying factor: husband,animals Duration since 2017  No suicidal toughts or admission      Past Psychiatric History: anxiety    Past Medical History:  Past Medical History:  Diagnosis Date   Anxiety    Asthma    Asthma     Past Surgical History:  Procedure Laterality Date   WISDOM TOOTH EXTRACTION      Family Psychiatric History: dad : depression , anxieyt  Family History:  Family History  Problem Relation Age of Onset   Diabetes Father    Heart disease Father    Cancer Paternal Grandfather     Social History:   Social History   Socioeconomic History   Marital status: Married    Spouse name: Not on file   Number of children: Not on file   Years of education: Not on file   Highest education level: Not on file  Occupational History   Not on file  Tobacco Use   Smoking status: Former    Types: Cigarettes   Smokeless tobacco: Never  Substance and Sexual Activity   Alcohol use: No   Drug use: No   Sexual activity: Yes    Partners: Male    Birth control/protection: Pill, None  Other Topics Concern   Not on file  Social History Narrative   Not on file   Social Determinants of Health   Financial Resource Strain: Not on file  Food Insecurity: Not on file  Transportation Needs: Not on file  Physical Activity: Not on file  Stress: Not on file  Social Connections: Not on file      Allergies:  No Known Allergies  Metabolic Disorder Labs: Lab Results  Component Value Date   HGBA1C 5.7 (H) 01/26/2018   Lab Results  Component Value Date   PROLACTIN 8.4 01/26/2018   No results found for: CHOL, TRIG, HDL, CHOLHDL, VLDL, LDLCALC Lab Results  Component Value Date   TSH 1.450 01/26/2018    Therapeutic Level Labs: No results found for: LITHIUM No results found for: CBMZ No results found for: VALPROATE  Current Medications: Current Outpatient Medications  Medication Sig Dispense  Refill   albuterol (PROVENTIL HFA;VENTOLIN HFA) 108 (90 BASE) MCG/ACT inhaler Inhale 1-2 puffs into the lungs every 6 (six) hours as needed for wheezing or shortness of breath. 1 Inhaler 0   escitalopram (LEXAPRO) 10 MG tablet Take 1 tablet (10 mg total) by mouth daily. 90 tablet 0   letrozole (FEMARA) 2.5 MG tablet 7.5mg (3 tabs) daily on days 5-9 of cycle. 15 tablet 0   medroxyPROGESTERone (PROVERA) 10 MG tablet Take 1 tablet (10 mg total) by mouth daily. 10 tablet 0   metFORMIN (GLUCOPHAGE) 500 MG tablet TAKE 2 TABLETS BY MOUTH TWICE A DAY 360 tablet 0   No current facility-administered medications for this visit.      Psychiatric Specialty Exam: Review of Systems  Cardiovascular:  Negative for chest pain.  Psychiatric/Behavioral:  Negative for agitation and dysphoric mood.    There were no vitals taken for this visit.There is no height or weight on file to calculate BMI.  General Appearance: casual  Eye Contact: fair  Speech:  Slow  Volume:  Decreased  Mood: Stable  Affect:  Congruent  Thought Process:  Goal Directed  Orientation:  Full (Time, Place, and Person)  Thought Content:  Rumination  Suicidal Thoughts:  No  Homicidal Thoughts:  No  Memory:  Immediate;   Fair Recent;   Fair  Judgement:  Fair  Insight:  Shallow  Psychomotor Activity:  Normal  Concentration:  Concentration: Fair and Attention Span: Fair  Recall:  [employer] of Knowledge:Good  Language: Good  Akathisia:  No  Handed:   AIMS (if indicated):  not done  Assets:  Desire for Improvement Financial Resources/Insurance Physical Health Social Support  ADL's:  Intact  Cognition: WNL  Sleep:  Fair   Screenings: Flowsheet Row Video Visit from 02/03/2021 in [place] Video Visit from 08/07/2020 in [place] Video Visit from 05/08/2020 in [place] No Risk No Risk  No Risk       Assessment and Plan: as follows  Prior documentation reviewed  MDD moderate: reasonable, continue lexapro Will increase to 10mg  to help with possible adhd   GAD with panic: stable on lexapro ,  Adhd: possible mild, plan to increase lexapro to 10mg  to help with anxiety and adhd Other options and risk discussed  Follow-up in 9m Merian Capron, MD 1/16/20233:07 PM

## 2021-06-02 ENCOUNTER — Telehealth (HOSPITAL_COMMUNITY): Payer: BC Managed Care – PPO | Admitting: Psychiatry

## 2021-08-09 ENCOUNTER — Telehealth: Payer: Self-pay | Admitting: Nurse Practitioner

## 2021-08-09 DIAGNOSIS — F410 Panic disorder [episodic paroxysmal anxiety] without agoraphobia: Secondary | ICD-10-CM

## 2021-08-09 MED ORDER — HYDROXYZINE PAMOATE 25 MG PO CAPS: 25.0000 mg | ORAL_CAPSULE | Freq: Three times a day (TID) | ORAL | 0 refills | Status: DC | PRN

## 2021-08-09 NOTE — Patient Instructions (Signed)

## 2021-08-09 NOTE — Progress Notes (Signed)
Virtual Visit Consent   Brenda Bauer, you are scheduled for a virtual visit with [name], FNP, a [facility] provider, today.     Just as with appointments in the office, your consent must be obtained to participate.  Your consent will be active for this visit and any virtual visit you may have with one of our providers in the next 365 days.     If you have a MyChart account, a copy of this consent can be sent to you electronically.  All virtual visits are billed to your insurance company just like a traditional visit in the office.    As this is a virtual visit, video technology does not allow for your provider to perform a traditional examination.  This may limit your provider's ability to fully assess your condition.  If your provider identifies any concerns that need to be evaluated in person or the need to arrange testing (such as labs, EKG, etc.), we will make arrangements to do so.     Although advances in technology are sophisticated, we cannot ensure that it will always work on either your end or our end.  If the connection with a video visit is poor, the visit may have to be switched to a telephone visit.  With either a video or telephone visit, we are not always able to ensure that we have a secure connection.     I need to obtain your verbal consent now.   Are you willing to proceed with your visit today? YES   [facility] has provided verbal consent on 08/09/2021 for a virtual visit (video or telephone).   [name], FNP   Date: 08/09/2021 1:13 PM   Virtual Visit via Video Note   I, [name], connected with Brenda Bauer ([id number], [birth year]) on 08/09/21 at  1:15 PM EDT by a video-enabled telemedicine application and verified that I am speaking with the correct person using two identifiers.  Location: Patient: Virtual Visit Location Patient: Home Provider: Virtual Visit Location Provider: Mobile   I discussed the  limitations of evaluation and management by telemedicine and the availability of in person appointments. The patient expressed understanding and agreed to proceed.    History of Present Illness: Brenda Bauer is a 31 y.o. who identifies as a female who was assigned female at birth, and is being seen today for anxiety.  HPI: Patient does video visit c/o anxiety. She see psych every 6 months and he currently has her on lexapro 10mg  1/2 tablet daily. She says 10mg  of lexarpro makes her nauseous. Thursday of this past week she began becoming very anxious. She has had a lot of stress in personal life. She is has chest pain, nausea and jitterness, like he is having a panic attack. She has not tried any OTC meds.     Review of Systems  Constitutional:  Negative for diaphoresis and weight loss.  Eyes:  Negative for blurred vision, double vision and pain.  Respiratory:  Negative for shortness of breath.   Cardiovascular:  Negative for chest pain, palpitations, orthopnea and leg swelling.  Gastrointestinal:  Negative for abdominal pain.  Skin:  Negative for rash.  Neurological:  Negative for dizziness, sensory change, loss of consciousness, weakness and headaches.  Endo/Heme/Allergies:  Negative for polydipsia. Does not bruise/bleed easily.  Psychiatric/Behavioral:  Negative for depression and memory loss. The patient is nervous/anxious. The patient does not have insomnia.   All other systems reviewed and are negative.  Problems:  Patient Active Problem List   Diagnosis Date Noted   Overweight 01/26/2018    Allergies: No Known Allergies Medications:  Current Outpatient Medications:    albuterol (PROVENTIL HFA;VENTOLIN HFA) 108 (90 BASE) MCG/ACT inhaler, Inhale 1-2 puffs into the lungs every 6 (six) hours as needed for wheezing or shortness of breath., Disp: 1 Inhaler, Rfl: 0   escitalopram (LEXAPRO) 10 MG tablet, Take 1 tablet (10 mg total) by mouth daily., Disp: 90 tablet, Rfl: 0    letrozole (FEMARA) 2.5 MG tablet, 7.5mg (3 tabs) daily on days 5-9 of cycle., Disp: 15 tablet, Rfl: 0   medroxyPROGESTERone (PROVERA) 10 MG tablet, Take 1 tablet (10 mg total) by mouth daily., Disp: 10 tablet, Rfl: 0   metFORMIN (GLUCOPHAGE) 500 MG tablet, TAKE 2 TABLETS BY MOUTH TWICE A DAY, Disp: 360 tablet, Rfl: 0  Observations/Objective: Patient is well-developed, well-nourished in no acute distress.  Resting comfortably  at home.  Head is normocephalic, atraumatic.  No labored breathing.  Speech is clear and coherent with logical content.  Patient is alert and oriented at baseline.    Assessment and Plan:  Brenda Bauer in today with chief complaint of No chief complaint on file.   1. Panic attack Stress management Call psych on Monday If worsens go to the ED. Increase lexapro to 10mg  tablet nightly - hydrOXYzine (VISTARIL) 25 MG capsule; Take 1 capsule (25 mg total) by mouth every 8 (eight) hours as needed.  Dispense: 4 capsule; Refill: 0     Follow Up Instructions: I discussed the assessment and treatment plan with the patient. The patient was provided an opportunity to ask questions and all were answered. The patient agreed with the plan and demonstrated an understanding of the instructions.  A copy of instructions were sent to the patient via MyChart.  The patient was advised to call back or seek an in-person evaluation if the symptoms worsen or if the condition fails to improve as anticipated.  Time:  I spent 8 minutes with the patient via telehealth technology discussing the above problems/concerns.    Mary-Margaret , FNP

## 2021-09-08 ENCOUNTER — Encounter (HOSPITAL_COMMUNITY): Payer: Self-pay | Admitting: Psychiatry

## 2021-09-08 ENCOUNTER — Telehealth (INDEPENDENT_AMBULATORY_CARE_PROVIDER_SITE_OTHER): Payer: Self-pay | Admitting: Psychiatry

## 2021-09-08 DIAGNOSIS — F410 Panic disorder [episodic paroxysmal anxiety] without agoraphobia: Secondary | ICD-10-CM

## 2021-09-08 DIAGNOSIS — F4010 Social phobia, unspecified: Secondary | ICD-10-CM

## 2021-09-08 DIAGNOSIS — F411 Generalized anxiety disorder: Secondary | ICD-10-CM

## 2021-09-08 DIAGNOSIS — F331 Major depressive disorder, recurrent, moderate: Secondary | ICD-10-CM

## 2021-09-08 MED ORDER — ESCITALOPRAM OXALATE 5 MG PO TABS: 5.0000 mg | ORAL_TABLET | Freq: Every day | ORAL | 0 refills | Status: DC

## 2021-09-08 MED ORDER — HYDROXYZINE PAMOATE 25 MG PO CAPS: 25.0000 mg | ORAL_CAPSULE | Freq: Two times a day (BID) | ORAL | 0 refills | Status: DC | PRN

## 2021-09-08 MED ORDER — ESCITALOPRAM OXALATE 10 MG PO TABS: 10.0000 mg | ORAL_TABLET | Freq: Every day | ORAL | 0 refills | Status: DC

## 2021-09-08 NOTE — Progress Notes (Signed)
[facility] Follow up visit  Patient Identification: Brenda Bauer MRN:  [id number] Date of Evaluation:  09/08/2021 Referral Source: OB/ Provider Chief Complaint:   anxiety, depression  follow up  Visit Diagnosis:    ICD-10-CM   1. MDD (major depressive disorder), recurrent episode, moderate (HCC)  F33.1     2. GAD (generalized anxiety disorder)  F41.1     3. Social anxiety disorder  F40.10     4. Panic attacks  F41.0     5. Panic attack  F41.0 hydrOXYzine (VISTARIL) 25 MG capsule      Virtual Visit via Video Note  I connected with Brenda Bauer on 09/08/21 at  2:30 PM EDT by a video enabled telemedicine application and verified that I am speaking with the correct person using two identifiers.  Location: Patient: parked car  Provider: home office   I discussed the limitations of evaluation and management by telemedicine and the availability of in person appointments. The patient expressed understanding and agreed to proceed.      I discussed the assessment and treatment plan with the patient. The patient was provided an opportunity to ask questions and all were answered. The patient agreed with the plan and demonstrated an understanding of the instructions.   The patient was advised to call back or seek an in-person evaluation if the symptoms worsen or if the condition fails to improve as anticipated.  I provided 15 minutes of non-face-to-face time during this encounter.    History of Present Illness: Patient is a 31 years old currently married female initially referred by provider for management of anxiety. She works as a Midwife.  Was doing fair on lexapro but notiicng anxiety and panic attacks, fear of dying Stress level can be high at times   Aggravating factor: difficult childhood with dad, increase stressor and of dying Modifying factor: husband, animals Duration since 2017  No suicidal toughts or admission      Past Psychiatric History: anxiety     Past Medical History:  Past Medical History:  Diagnosis Date   Anxiety    Asthma    Asthma     Past Surgical History:  Procedure Laterality Date   WISDOM TOOTH EXTRACTION      Family Psychiatric History: dad : depression , anxieyt  Family History:  Family History  Problem Relation Age of Onset   Diabetes Father    Heart disease Father    Cancer Paternal Grandfather     Social History:   Social History   Socioeconomic History   Marital status: Married    Spouse name: Not on file   Number of children: Not on file   Years of education: Not on file   Highest education level: Not on file  Occupational History   Not on file  Tobacco Use   Smoking status: Former    Types: Cigarettes   Smokeless tobacco: Never  Substance and Sexual Activity   Alcohol use: No   Drug use: No   Sexual activity: Yes    Partners: Male    Birth control/protection: Pill, None  Other Topics Concern   Not on file  Social History Narrative   Not on file   Social Determinants of Health   Financial Resource Strain: Not on file  Food Insecurity: Not on file  Transportation Needs: Not on file  Physical Activity: Not on file  Stress: Not on file  Social Connections: Not on file      Allergies:  No Known Allergies  Metabolic Disorder Labs: Lab Results  Component Value Date   HGBA1C 5.7 (H) 01/26/2018   Lab Results  Component Value Date   PROLACTIN 8.4 01/26/2018   No results found for: "CHOL", "TRIG", "HDL", "CHOLHDL", "VLDL", "LDLCALC" Lab Results  Component Value Date   TSH 1.450 01/26/2018    Therapeutic Level Labs: No results found for: "LITHIUM" No results found for: "CBMZ" No results found for: "VALPROATE"  Current Medications: Current Outpatient Medications  Medication Sig Dispense Refill   albuterol (PROVENTIL HFA;VENTOLIN HFA) 108 (90 BASE) MCG/ACT inhaler Inhale 1-2 puffs into the lungs every 6 (six) hours as needed for wheezing or shortness of breath. 1  Inhaler 0   escitalopram (LEXAPRO) 10 MG tablet Take 1 tablet (10 mg total) by mouth daily. 90 tablet 0   escitalopram (LEXAPRO) 5 MG tablet Take 1 tablet (5 mg total) by mouth daily. 30 tablet 0   hydrOXYzine (VISTARIL) 25 MG capsule Take 1 capsule (25 mg total) by mouth 2 (two) times daily as needed. 60 capsule 0   letrozole (FEMARA) 2.5 MG tablet 7.5mg (3 tabs) daily on days 5-9 of cycle. 15 tablet 0   medroxyPROGESTERone (PROVERA) 10 MG tablet Take 1 tablet (10 mg total) by mouth daily. 10 tablet 0   metFORMIN (GLUCOPHAGE) 500 MG tablet TAKE 2 TABLETS BY MOUTH TWICE A DAY 360 tablet 0   No current facility-administered medications for this visit.      Psychiatric Specialty Exam: Review of Systems  Cardiovascular:  Negative for chest pain.  Neurological:  Negative for tremors.  Psychiatric/Behavioral:  Negative for agitation and dysphoric mood. The patient is nervous/anxious.     There were no vitals taken for this visit.There is no height or weight on file to calculate BMI.  General Appearance: casual  Eye Contact: fair  Speech:  Slow  Volume:  Decreased  Mood: anxious  Affect:  Congruent  Thought Process:  Goal Directed  Orientation:  Full (Time, Place, and Person)  Thought Content:  Rumination  Suicidal Thoughts:  No  Homicidal Thoughts:  No  Memory:  Immediate;   Fair Recent;   Fair  Judgement:  Fair  Insight:  Shallow  Psychomotor Activity:  Normal  Concentration:  Concentration: Fair and Attention Span: Fair  Recall:  [employer] of Knowledge:Good  Language: Good  Akathisia:  No  Handed:   AIMS (if indicated):  not done  Assets:  Desire for Improvement Financial Resources/Insurance Physical Health Social Support  ADL's:  Intact  Cognition: WNL  Sleep:  Fair   Screenings: GAD-7    Flowsheet Row Video Visit from 08/09/2021 in [facility]  Total GAD-7 Score 13      Flowsheet Row Video Visit from 09/08/2021 [facility]  AT [place] Video Visit from 02/03/2021 in [facility] AT [place] Video Visit from 08/07/2020 in [facility] AT [place]  C-SSRS RISK CATEGORY No Risk No Risk No Risk       Assessment and Plan: as follows Prior documentation reviewed  MDD moderate: fair but gets subdued increase lexapro to 15mg    GAD with panic: recurrence of anxiety and panic attacks, increase lexapro to 10 plus 5mg  , add vistaril bid or qd for break through panic attacks Adhd:possible mild: for now getting anxiety in control as above Fu 62m.   , MD 8/21/20232:42 PM

## 2021-10-14 ENCOUNTER — Other Ambulatory Visit (HOSPITAL_COMMUNITY): Payer: Self-pay

## 2021-10-14 MED ORDER — ESCITALOPRAM OXALATE 10 MG PO TABS: 15.0000 mg | ORAL_TABLET | Freq: Every day | ORAL | 0 refills | Status: DC

## 2021-11-10 ENCOUNTER — Encounter (HOSPITAL_COMMUNITY): Payer: Self-pay

## 2021-11-10 ENCOUNTER — Telehealth (HOSPITAL_COMMUNITY): Payer: Self-pay | Admitting: Psychiatry

## 2021-11-11 ENCOUNTER — Other Ambulatory Visit (HOSPITAL_COMMUNITY): Payer: Self-pay

## 2021-11-11 MED ORDER — ESCITALOPRAM OXALATE 10 MG PO TABS: 15.0000 mg | ORAL_TABLET | Freq: Every day | ORAL | 0 refills | Status: DC

## 2021-11-17 ENCOUNTER — Telehealth (INDEPENDENT_AMBULATORY_CARE_PROVIDER_SITE_OTHER): Payer: Self-pay | Admitting: Psychiatry

## 2021-11-17 ENCOUNTER — Encounter (HOSPITAL_COMMUNITY): Payer: Self-pay | Admitting: Psychiatry

## 2021-11-17 DIAGNOSIS — F410 Panic disorder [episodic paroxysmal anxiety] without agoraphobia: Secondary | ICD-10-CM

## 2021-11-17 DIAGNOSIS — F411 Generalized anxiety disorder: Secondary | ICD-10-CM

## 2021-11-17 DIAGNOSIS — F331 Major depressive disorder, recurrent, moderate: Secondary | ICD-10-CM

## 2021-11-17 MED ORDER — ESCITALOPRAM OXALATE 10 MG PO TABS: 15.0000 mg | ORAL_TABLET | Freq: Every day | ORAL | 0 refills | Status: DC

## 2021-11-17 NOTE — Progress Notes (Signed)
[facility] Follow up visit  Patient Identification: Brenda Bauer MRN:  [id number] Date of Evaluation:  11/17/2021 Referral Source: OB/ Provider Chief Complaint:   anxiety, depression  follow up  Visit Diagnosis:    ICD-10-CM   1. MDD (major depressive disorder), recurrent episode, moderate (HCC)  F33.1     2. GAD (generalized anxiety disorder)  F41.1     3. Panic attacks  F41.0     Video didn't connect so did audio  Virtual Visit via audio Note  I connected with Brenda Bauer on 11/17/21 at  4:00 PM EDT by a audio enabled telemedicine application and verified that I am speaking with the correct person using two identifiers.  Location: Patient: home Provider: home office   I discussed the limitations of evaluation and management by telemedicine and the availability of in person appointments. The patient expressed understanding and agreed to proceed.      I discussed the assessment and treatment plan with the patient. The patient was provided an opportunity to ask questions and all were answered. The patient agreed with the plan and demonstrated an understanding of the instructions.   The patient was advised to call back or seek an in-person evaluation if the symptoms worsen or if the condition fails to improve as anticipated.  I provided 15 minutes of non-face-to-face time during this encounter.    History of Present Illness: Patient is a 31 years old currently married female initially referred by provider for management of anxiety. She works as a Human resources officer.  Last visit increased leapro to 15mg  has helped panic and worrying related to fears and dying   Aggravating factor: difficult childhood with dad, increase stressor and of dying Modifying factor: husband, animals Duration since 2017  No suicidal toughts or admission      Past Psychiatric History: anxiety    Past Medical History:  Past Medical History:  Diagnosis Date   Anxiety    Asthma    Asthma      Past Surgical History:  Procedure Laterality Date   WISDOM TOOTH EXTRACTION      Family Psychiatric History: dad : depression , anxieyt  Family History:  Family History  Problem Relation Age of Onset   Diabetes Father    Heart disease Father    Cancer Paternal Grandfather     Social History:   Social History   Socioeconomic History   Marital status: Married    Spouse name: Not on file   Number of children: Not on file   Years of education: Not on file   Highest education level: Not on file  Occupational History   Not on file  Tobacco Use   Smoking status: Former    Types: Cigarettes   Smokeless tobacco: Never  Substance and Sexual Activity   Alcohol use: No   Drug use: No   Sexual activity: Yes    Partners: Male    Birth control/protection: Pill, None  Other Topics Concern   Not on file  Social History Narrative   Not on file   Social Determinants of Health   Financial Resource Strain: Not on file  Food Insecurity: Not on file  Transportation Needs: Not on file  Physical Activity: Not on file  Stress: Not on file  Social Connections: Not on file      Allergies:  No Known Allergies  Metabolic Disorder Labs: Lab Results  Component Value Date   HGBA1C 5.7 (H) 01/26/2018   Lab Results  Component  Value Date   PROLACTIN 8.4 01/26/2018   No results found for: "CHOL", "TRIG", "HDL", "CHOLHDL", "VLDL", "LDLCALC" Lab Results  Component Value Date   TSH 1.450 01/26/2018    Therapeutic Level Labs: No results found for: "LITHIUM" No results found for: "CBMZ" No results found for: "VALPROATE"  Current Medications: Current Outpatient Medications  Medication Sig Dispense Refill   albuterol (PROVENTIL HFA;VENTOLIN HFA) 108 (90 BASE) MCG/ACT inhaler Inhale 1-2 puffs into the lungs every 6 (six) hours as needed for wheezing or shortness of breath. 1 Inhaler 0   escitalopram (LEXAPRO) 10 MG tablet Take 1.5 tablets (15 mg total) by mouth daily. 135 tablet 0    hydrOXYzine (VISTARIL) 25 MG capsule Take 1 capsule (25 mg total) by mouth 2 (two) times daily as needed. 60 capsule 0   letrozole (FEMARA) 2.5 MG tablet 7.5mg (3 tabs) daily on days 5-9 of cycle. 15 tablet 0   medroxyPROGESTERone (PROVERA) 10 MG tablet Take 1 tablet (10 mg total) by mouth daily. 10 tablet 0   metFORMIN (GLUCOPHAGE) 500 MG tablet TAKE 2 TABLETS BY MOUTH TWICE A DAY 360 tablet 0   No current facility-administered medications for this visit.      Psychiatric Specialty Exam: Review of Systems  Cardiovascular:  Negative for chest pain.  Neurological:  Negative for tremors.  Psychiatric/Behavioral:  Negative for agitation and dysphoric mood.     There were no vitals taken for this visit.There is no height or weight on file to calculate BMI.  General Appearance: casual  Eye Contact: fair  Speech:  Slow  Volume:  Decreased  Mood: better  Affect:  Congruent  Thought Process:  Goal Directed  Orientation:  Full (Time, Place, and Person)  Thought Content:  Rumination  Suicidal Thoughts:  No  Homicidal Thoughts:  No  Memory:  Immediate;   Fair Recent;   Fair  Judgement:  Fair  Insight:  Shallow  Psychomotor Activity:  Normal  Concentration:  Concentration: Fair and Attention Span: Fair  Recall:  [employer] of Knowledge:Good  Language: Good  Akathisia:  No  Handed:   AIMS (if indicated):  not done  Assets:  Desire for Improvement Financial Resources/Insurance Physical Health Social Support  ADL's:  Intact  Cognition: WNL  Sleep:  Fair   Screenings: GAD-7    Flowsheet Row Video Visit from 08/09/2021 in [facility]  Total GAD-7 Score 13      Flowsheet Row Video Visit from 11/17/2021 in [facility] AT [place] Video Visit from 09/08/2021 in [facility] AT [place] Video Visit from 02/03/2021 in [facility] AT [place]  C-SSRS RISK CATEGORY No Risk No Risk No Risk        Assessment and Plan: as follows  Prior documentation reviewed  MDD moderate:fair contine lexapro  GAD with panic: improved, seldom takes vistaril continue lexapro 15mg . Refill 90 days sent  Fu 75m.   1m, MD 10/30/20234:21 PM

## 2022-02-16 ENCOUNTER — Encounter (HOSPITAL_COMMUNITY): Payer: Self-pay | Admitting: Psychiatry

## 2022-02-16 ENCOUNTER — Telehealth (INDEPENDENT_AMBULATORY_CARE_PROVIDER_SITE_OTHER): Payer: Self-pay | Admitting: Psychiatry

## 2022-02-16 ENCOUNTER — Telehealth (HOSPITAL_COMMUNITY): Payer: Self-pay | Admitting: Psychiatry

## 2022-02-16 DIAGNOSIS — F4001 Agoraphobia with panic disorder: Secondary | ICD-10-CM

## 2022-02-16 DIAGNOSIS — F4010 Social phobia, unspecified: Secondary | ICD-10-CM

## 2022-02-16 DIAGNOSIS — F410 Panic disorder [episodic paroxysmal anxiety] without agoraphobia: Secondary | ICD-10-CM

## 2022-02-16 DIAGNOSIS — F331 Major depressive disorder, recurrent, moderate: Secondary | ICD-10-CM

## 2022-02-16 DIAGNOSIS — F411 Generalized anxiety disorder: Secondary | ICD-10-CM

## 2022-02-16 MED ORDER — ESCITALOPRAM OXALATE 10 MG PO TABS: 15.0000 mg | ORAL_TABLET | Freq: Every day | ORAL | 0 refills | Status: DC

## 2022-02-16 NOTE — Progress Notes (Signed)
[facility] Follow up visit  Patient Identification: Brenda Bauer MRN:  [id number] Date of Evaluation:  02/16/2022 Referral Source: OB/ Provider Chief Complaint:   anxiety, depression  follow up  Visit Diagnosis:    ICD-10-CM   1. MDD (major depressive disorder), recurrent episode, moderate (HCC)  F33.1     2. GAD (generalized anxiety disorder)  F41.1     3. Social anxiety disorder  F40.10     Virtual Visit via Video Note  I connected with Brenda Bauer on 02/16/22 at  1:30 PM EST by a video enabled telemedicine application and verified that I am speaking with the correct person using two identifiers.  Location: Patient: home Provider: home office   I discussed the limitations of evaluation and management by telemedicine and the availability of in person appointments. The patient expressed understanding and agreed to proceed.    I discussed the assessment and treatment plan with the patient. The patient was provided an opportunity to ask questions and all were answered. The patient agreed with the plan and demonstrated an understanding of the instructions.   The patient was advised to call back or seek an in-person evaluation if the symptoms worsen or if the condition fails to improve as anticipated.  I provided 15 minutes of non-face-to-face time during this encounter.       History of Present Illness: Patient is a 32 years old currently married female initially referred by provider for management of anxiety. She works as a Human resources officer.  Doing better, less anxious, supportive husband  Seldom takes vistaril for anxiety Aggravating factor: difficult childhood with dad, anxiety of dying Modifying factor: husband,  animals Duration since 2017  No suicidal toughts or admission  Severity improved    Past Psychiatric History: anxiety    Past Medical History:  Past Medical History:  Diagnosis Date   Anxiety    Asthma    Asthma     Past Surgical History:   Procedure Laterality Date   WISDOM TOOTH EXTRACTION      Family Psychiatric History: dad : depression , anxieyt  Family History:  Family History  Problem Relation Age of Onset   Diabetes Father    Heart disease Father    Cancer Paternal Grandfather     Social History:   Social History   Socioeconomic History   Marital status: Married    Spouse name: Not on file   Number of children: Not on file   Years of education: Not on file   Highest education level: Not on file  Occupational History   Not on file  Tobacco Use   Smoking status: Former    Types: Cigarettes   Smokeless tobacco: Never  Substance and Sexual Activity   Alcohol use: No   Drug use: No   Sexual activity: Yes    Partners: Male    Birth control/protection: Pill, None  Other Topics Concern   Not on file  Social History Narrative   Not on file   Social Determinants of Health   Financial Resource Strain: Not on file  Food Insecurity: Not on file  Transportation Needs: Not on file  Physical Activity: Not on file  Stress: Not on file  Social Connections: Not on file      Allergies:  No Known Allergies  Metabolic Disorder Labs: Lab Results  Component Value Date   HGBA1C 5.7 (H) 01/26/2018   Lab Results  Component Value Date   PROLACTIN 8.4 01/26/2018   No results  found for: "CHOL", "TRIG", "HDL", "CHOLHDL", "VLDL", "LDLCALC" Lab Results  Component Value Date   TSH 1.450 01/26/2018    Therapeutic Level Labs: No results found for: "LITHIUM" No results found for: "CBMZ" No results found for: "VALPROATE"  Current Medications: Current Outpatient Medications  Medication Sig Dispense Refill   albuterol (PROVENTIL HFA;VENTOLIN HFA) 108 (90 BASE) MCG/ACT inhaler Inhale 1-2 puffs into the lungs every 6 (six) hours as needed for wheezing or shortness of breath. 1 Inhaler 0   escitalopram (LEXAPRO) 10 MG tablet Take 1.5 tablets (15 mg total) by mouth daily. 135 tablet 0   hydrOXYzine  (VISTARIL) 25 MG capsule Take 1 capsule (25 mg total) by mouth 2 (two) times daily as needed. 60 capsule 0   letrozole (FEMARA) 2.5 MG tablet 7.5mg (3 tabs) daily on days 5-9 of cycle. 15 tablet 0   medroxyPROGESTERone (PROVERA) 10 MG tablet Take 1 tablet (10 mg total) by mouth daily. 10 tablet 0   metFORMIN (GLUCOPHAGE) 500 MG tablet TAKE 2 TABLETS BY MOUTH TWICE A DAY 360 tablet 0   No current facility-administered medications for this visit.      Psychiatric Specialty Exam: Review of Systems  Cardiovascular:  Negative for chest pain.  Neurological:  Negative for tremors.  Psychiatric/Behavioral:  Negative for agitation and dysphoric mood.     There were no vitals taken for this visit.There is no height or weight on file to calculate BMI.  General Appearance: casual  Eye Contact: fair  Speech:  Slow  Volume:  Decreased  Mood: better  Affect:  Congruent  Thought Process:  Goal Directed  Orientation:  Full (Time, Place, and Person)  Thought Content:  Rumination  Suicidal Thoughts:  No  Homicidal Thoughts:  No  Memory:  Immediate;   Fair Recent;   Fair  Judgement:  Fair  Insight:  Shallow  Psychomotor Activity:  Normal  Concentration:  Concentration: Fair and Attention Span: Fair  Recall:  [employer] of Knowledge:Good  Language: Good  Akathisia:  No  Handed:   AIMS (if indicated):  not done  Assets:  Desire for Improvement Financial Resources/Insurance Physical Health Social Support  ADL's:  Intact  Cognition: WNL  Sleep:  Fair   Screenings: GAD-7    Flowsheet Row Video Visit from 08/09/2021 in [facility]  Total GAD-7 Score 13      Flowsheet Row Video Visit from 11/17/2021 in [place] at [facility] Video Visit from 09/08/2021 in [place] [facility] Video Visit from 02/03/2021 in [place] at [place] Error: Question 2 not populated No Risk No Risk       Assessment and Plan: as follows  Prior documentation reviewed   MDD moderate: fair continue lexapro  GAD with panic:doing better, continue lexapro, vistaril prn Social anxiety : improved, feels meds are helping will continue lexapro, refills sent Has vistaril takes prn Fu 43m.     Merian Capron, MD 1/29/20241:33 PM

## 2022-06-29 ENCOUNTER — Telehealth (HOSPITAL_COMMUNITY): Payer: Self-pay | Admitting: Psychiatry

## 2022-06-29 DIAGNOSIS — F410 Panic disorder [episodic paroxysmal anxiety] without agoraphobia: Secondary | ICD-10-CM

## 2022-06-29 NOTE — Telephone Encounter (Signed)
Patient called requesting message be sent to provider that the hydrOXYzine (VISTARIL) 25 MG capsule that she takes for breakthrough anxiety is causing excessive fatigue and sleepiness. Request consideration of an alternative medication if possible and if ordered for it for this to be sent to:  [facility] [id number] - [place], [place] - [address] Phone: [phone]  Fax: [phone]     Last visit: 02/16/2022  Next visit: 08/17/2022

## 2022-06-30 ENCOUNTER — Telehealth (HOSPITAL_COMMUNITY): Payer: Self-pay | Admitting: *Deleted

## 2022-06-30 NOTE — Telephone Encounter (Signed)
LVM ASKING PATIENT PER PROVIDER --IF SHE WOULD BE INTERESTED IN TRYING hydrOXYzine (VISTARIL) 10 MG capsule ??

## 2022-07-01 MED ORDER — HYDROXYZINE HCL 10 MG PO TABS: 10.0000 mg | ORAL_TABLET | Freq: Every day | ORAL | 0 refills | Status: DC | PRN

## 2022-07-01 NOTE — Telephone Encounter (Signed)
Spoke with patient & informed per provider ::  sent 10mg  tablets, its a small dose less sedation if any.  Most anti anxiety meds can cause sedation

## 2022-08-17 ENCOUNTER — Telehealth (INDEPENDENT_AMBULATORY_CARE_PROVIDER_SITE_OTHER): Payer: Self-pay | Admitting: Psychiatry

## 2022-08-17 ENCOUNTER — Encounter (HOSPITAL_COMMUNITY): Payer: Self-pay | Admitting: Psychiatry

## 2022-08-17 DIAGNOSIS — F410 Panic disorder [episodic paroxysmal anxiety] without agoraphobia: Secondary | ICD-10-CM

## 2022-08-17 DIAGNOSIS — F411 Generalized anxiety disorder: Secondary | ICD-10-CM

## 2022-08-17 DIAGNOSIS — F331 Major depressive disorder, recurrent, moderate: Secondary | ICD-10-CM

## 2022-08-17 NOTE — Progress Notes (Signed)
[facility] Follow up visit  Patient Identification: Brenda Bauer MRN:  [id number] Date of Evaluation:  08/17/2022 Referral Source: OB/ Provider Chief Complaint:   anxiety, depression  follow up  Visit Diagnosis:    ICD-10-CM   1. Panic attack  F41.0     2. MDD (major depressive disorder), recurrent episode, moderate (HCC)  F33.1     3. GAD (generalized anxiety disorder)  F41.1       Virtual Visit via Video Note  I connected with Lorine Bears on 08/17/22 at  1:30 PM EDT by a video enabled telemedicine application and verified that I am speaking with the correct person using two identifiers.  Location: Patient: parked car Provider: home office   I discussed the limitations of evaluation and management by telemedicine and the availability of in person appointments. The patient expressed understanding and agreed to proceed.      I discussed the assessment and treatment plan with the patient. The patient was provided an opportunity to ask questions and all were answered. The patient agreed with the plan and demonstrated an understanding of the instructions.   The patient was advised to call back or seek an in-person evaluation if the symptoms worsen or if the condition fails to improve as anticipated.  I provided 20 minutes of non-face-to-face time during this encounter.     History of Present Illness: Patient is a 32 years old currently married female initially referred by provider for management of anxiety. She works as a Midwife.  Doing better and stable, job stress is manageable as well States wants to lower lexapro as doing stable  Seldom or takes vistaril if needed   Aggravating factor: difficult childhood with dad, anxiety of dying Modifying factor: husband,  animals Duration since 2017  No suicidal toughts or admission  Severity  improved     Past Psychiatric History: anxiety    Past Medical History:  Past Medical History:  Diagnosis Date   Anxiety     Asthma    Asthma     Past Surgical History:  Procedure Laterality Date   WISDOM TOOTH EXTRACTION      Family Psychiatric History: dad : depression , anxieyt  Family History:  Family History  Problem Relation Age of Onset   Diabetes Father    Heart disease Father    Cancer Paternal Grandfather     Social History:   Social History   Socioeconomic History   Marital status: Married    Spouse name: Not on file   Number of children: Not on file   Years of education: Not on file   Highest education level: Not on file  Occupational History   Not on file  Tobacco Use   Smoking status: Former    Types: Cigarettes   Smokeless tobacco: Never  Substance and Sexual Activity   Alcohol use: No   Drug use: No   Sexual activity: Yes    Partners: Male    Birth control/protection: Pill, None  Other Topics Concern   Not on file  Social History Narrative   Not on file   Social Determinants of Health   Financial Resource Strain: Medium Risk (04/05/2022)   [facility] from [facility]   Overall Financial Resource Strain (CARDIA)    Difficulty of Paying Living Expenses: Somewhat hard  Food Insecurity: Food Insecurity Present (04/05/2022)   Received from [facility]   Hunger Vital Sign    Worried About Running Out of  Food in the Last Year: Sometimes true    Ran Out of Food in the Last Year: Never true  Transportation Needs: No Transportation Needs (04/05/2022)   Received from [facility]   PRAPARE - Transportation    Lack of Transportation (Medical): No    Lack of Transportation (Non-Medical): No  Physical Activity: Insufficiently Active (04/05/2022)   Received from [facility]   Exercise Vital Sign    Days of Exercise per Week: 3 days    Minutes of Exercise per Session: 30 min  Stress: Stress Concern Present (04/05/2022)   Received from [facility]ational Health -  Occupational Stress Questionnaire    Feeling of Stress : To some extent  Social Connections: Socially Integrated (04/05/2022)   [facility] from [facility]   Social Network    How would you rate your social network (family, work, friends)?: Good participation with social networks      Allergies:  No Known Allergies  Metabolic Disorder Labs: Lab Results  Component Value Date   HGBA1C 5.7 (H) 01/26/2018   Lab Results  Component Value Date   PROLACTIN 8.4 01/26/2018   No results found for: "CHOL", "TRIG", "HDL", "CHOLHDL", "VLDL", "LDLCALC" Lab Results  Component Value Date   TSH 1.450 01/26/2018    Therapeutic Level Labs: No results found for: "LITHIUM" No results found for: "CBMZ" No results found for: "VALPROATE"  Current Medications: Current Outpatient Medications  Medication Sig Dispense Refill   albuterol (PROVENTIL HFA;VENTOLIN HFA) 108 (90 BASE) MCG/ACT inhaler Inhale 1-2 puffs into the lungs every 6 (six) hours as needed for wheezing or shortness of breath. 1 Inhaler 0   escitalopram (LEXAPRO) 10 MG tablet Take 1.5 tablets (15 mg total) by mouth daily. 135 tablet 0   hydrOXYzine (ATARAX) 10 MG tablet Take 1 tablet (10 mg total) by mouth daily as needed for anxiety. 30 tablet 0   hydrOXYzine (VISTARIL) 25 MG capsule Take 1 capsule (25 mg total) by mouth 2 (two) times daily as needed. 60 capsule 0   letrozole (FEMARA) 2.5 MG tablet 7.5mg (3 tabs) daily on days 5-9 of cycle. 15 tablet 0   medroxyPROGESTERone (PROVERA) 10 MG tablet Take 1 tablet (10 mg total) by mouth daily. 10 tablet 0   metFORMIN (GLUCOPHAGE) 500 MG tablet TAKE 2 TABLETS BY MOUTH TWICE A DAY 360 tablet 0   No current facility-administered medications for this visit.      Psychiatric Specialty Exam: Review of Systems  Cardiovascular:  Negative for chest pain.  Neurological:  Negative for tremors.  Psychiatric/Behavioral:  Negative for agitation and dysphoric mood.     There were  no vitals taken for this visit.There is no height or weight on file to calculate BMI.  General Appearance: casual  Eye Contact: fair  Speech:  Slow  Volume:  Decreased  Mood: better  Affect:  Congruent  Thought Process:  Goal Directed  Orientation:  Full (Time, Place, and Person)  Thought Content:  Rumination  Suicidal Thoughts:  No  Homicidal Thoughts:  No  Memory:  Immediate;   Fair Recent;   Fair  Judgement:  Fair  Insight:  Shallow  Psychomotor Activity:  Normal  Concentration:  Concentration: Fair and Attention Span: Fair  Recall:  [employer] of Knowledge:Good  Language: Good  Akathisia:  No  Handed:   AIMS (if indicated):  not done  Assets:  Desire for Improvement Financial Resources/Insurance Physical Health Social  Support  ADL's:  Intact  Cognition: WNL  Sleep:  Fair   Screenings: GAD-7    Flowsheet Row Video Visit from 08/09/2021 in [facility]  Total GAD-7 Score 13      Flowsheet Row Video Visit from 11/17/2021 in [facility] at [facility] Video Visit from 09/08/2021 in [facility] Behavior[facility] Health at [facility] Video Visit from 02/03/2021 in [facility] Outpatient Behavior[facility] at [facility]  C-SSRS RISK CATEGORY Error: Question 2 not populated No Risk No Risk       Assessment and Plan: as follows  Prior documentation reviewed   MDD moderate:stable continue lexapro 15mg  for now. Since doing stable, we can consider lowering dose next year or spring   GAD with panic:doing better continue lexparo, seldom takes vistairl continue to work on Pharmacologist  Social anxiety : manageable continue lexapro Reviewed meds, can lower if continues to do well till next year  Fu 38m. Has meds for now    Thresa Ross, MD 7/29/20241:24 PM

## 2022-11-26 ENCOUNTER — Other Ambulatory Visit (HOSPITAL_COMMUNITY): Payer: Self-pay | Admitting: Psychiatry

## 2022-11-30 ENCOUNTER — Telehealth (HOSPITAL_COMMUNITY): Payer: Self-pay | Admitting: *Deleted

## 2022-11-30 ENCOUNTER — Other Ambulatory Visit (HOSPITAL_COMMUNITY): Payer: Self-pay | Admitting: *Deleted

## 2022-11-30 MED ORDER — ESCITALOPRAM OXALATE 10 MG PO TABS: 15.0000 mg | ORAL_TABLET | Freq: Every day | ORAL | 0 refills | Status: DC

## 2022-11-30 NOTE — Telephone Encounter (Signed)
Reordered -Cosign       escitalopram (LEXAPRO) 10 MG tablet [Nadeem Akhtar]     Patient Comment: Only have 1 pill left.   Preferred pharmacy: [facility] [id number] - [place], [place] - [address]

## 2023-02-08 ENCOUNTER — Telehealth (HOSPITAL_COMMUNITY): Payer: Medicaid Other | Admitting: Psychiatry

## 2023-04-05 ENCOUNTER — Telehealth (HOSPITAL_COMMUNITY): Admitting: Psychiatry

## 2023-04-05 ENCOUNTER — Encounter (HOSPITAL_COMMUNITY): Payer: Self-pay

## 2023-04-12 ENCOUNTER — Encounter (HOSPITAL_COMMUNITY): Payer: Self-pay | Admitting: Psychiatry

## 2023-04-12 ENCOUNTER — Telehealth (INDEPENDENT_AMBULATORY_CARE_PROVIDER_SITE_OTHER): Admitting: Psychiatry

## 2023-04-12 DIAGNOSIS — F4010 Social phobia, unspecified: Secondary | ICD-10-CM | POA: Diagnosis not present

## 2023-04-12 DIAGNOSIS — F411 Generalized anxiety disorder: Secondary | ICD-10-CM

## 2023-04-12 DIAGNOSIS — F331 Major depressive disorder, recurrent, moderate: Secondary | ICD-10-CM | POA: Diagnosis not present

## 2023-04-12 MED ORDER — ESCITALOPRAM OXALATE 10 MG PO TABS: 15.0000 mg | ORAL_TABLET | Freq: Every day | ORAL | 1 refills | Status: DC

## 2023-04-12 NOTE — Progress Notes (Signed)
 [facility] Follow up visit  Patient Identification: Brenda Bauer MRN:  [id number] Date of Evaluation:  04/12/2023 Referral Source: OB/ Provider Chief Complaint:   re establish, follow up depression  Visit Diagnosis:    ICD-10-CM   1. MDD (major depressive disorder), recurrent episode, moderate (HCC)  F33.1     2. GAD (generalized anxiety disorder)  F41.1     3. Social anxiety disorder  F40.10      Virtual Visit via Video Note  I connected with KANCHAN GAL on 04/12/23 at  3:30 PM EDT by a video enabled telemedicine application and verified that I am speaking with the correct person using two identifiers.  Location: Patient: home Provider: home office   I discussed the limitations of evaluation and management by telemedicine and the availability of in person appointments. The patient expressed understanding and agreed to proceed.     I discussed the assessment and treatment plan with the patient. The patient was provided an opportunity to ask questions and all were answered. The patient agreed with the plan and demonstrated an understanding of the instructions.   The patient was advised to call back or seek an in-person evaluation if the symptoms worsen or if the condition fails to improve as anticipated.  I provided 18 minutes of non-face-to-face time during this encounter.         History of Present Illness: Patient is a 33 years old currently married female initially referred by provider for management of anxiety. She worked as a Midwife.  Last visit was 9 months ago she was doing stable and was planning to lower the dose of lexapro She then didn't follow up . States she got pregnant and cold [place] stopped her meds  Which decompensated her. She is 6 months pregnant. OB following her and she states ultra sound and checks ups her good.  Mood wise she is feeling edgy, down and then re started meds as recommended by OB has noticed some improvement is on lexapro 10mg .  Was on 15 mg last year  She is looking forward to baby and husband Is supportie  She is not suicidal  Aggravating factor: dificult childhood with dad Modifying factor: husband,  animals Duration since 2017  No suicidal toughts or admission  Severity depressed     Past Psychiatric History: anxiety    Past Medical History:  Past Medical History:  Diagnosis Date   Anxiety    Asthma    Asthma     Past Surgical History:  Procedure Laterality Date   WISDOM TOOTH EXTRACTION      Family Psychiatric History: dad : depression , anxieyt  Family History:  Family History  Problem Relation Age of Onset   Diabetes Father    Heart disease Father    Cancer Paternal Grandfather     Social History:   Social History   Socioeconomic History   Marital status: Married    Spouse name: Not on file   Number of children: Not on file   Years of education: Not on file   Highest education level: Not on file  Occupational History   Not on file  Tobacco Use   Smoking status: Former    Types: Cigarettes   Smokeless tobacco: Never  Substance and Sexual Activity   Alcohol use: No   Drug use: No   Sexual activity: Yes    Partners: Male    Birth control/protection: Pill, None  Other Topics Concern   Not on file  Social History Narrative   Not on file   Social Drivers of Health   Financial Resource Strain: Medium Risk (02/27/2023)   Received from [facility]   Overall Financial Resource Strain (CARDIA)    Difficulty of Paying Living Expenses: Somewhat hard  Food Insecurity: Food Insecurity Present (02/27/2023)   [facility] from [facility]   Hunger Vital Sign    Worried About Running Out of Food in the Last Year: Sometimes true    Ran Out of Food in the Last Year: Sometimes true  Transportation Needs: No Transportation Needs (02/27/2023)   Received from [facility] - Transportation    Lack of Transportation (Medical): No    Lack of Transportation (Non-Medical): No   Physical Activity: Sufficiently Active (02/27/2023)   Received from [facility]   Exercise Vital Sign    Days of Exercise per Week: 7 days    Minutes of Exercise per Session: 30 min  Stress: No Stress Concern Present (02/27/2023)   Received from [facility] of Occupational Health - Occupational Stress Questionnaire    Feeling of Stress : Only a little  Social Connections: Socially Integrated (02/27/2023)   Received from [facility]   Social Network    How would you rate your social network (family, work, friends)?: Good participation with social networks      Allergies:  No Known Allergies  Metabolic Disorder Labs: Lab Results  Component Value Date   HGBA1C 5.7 (H) 01/26/2018   Lab Results  Component Value Date   PROLACTIN 8.4 01/26/2018   No results found for: "CHOL", "TRIG", "HDL", "CHOLHDL", "VLDL", "LDLCALC" Lab Results  Component Value Date   TSH 1.450 01/26/2018    Therapeutic Level Labs: No results found for: "LITHIUM" No results found for: "CBMZ" No results found for: "VALPROATE"  Current Medications: Current Outpatient Medications  Medication Sig Dispense Refill   albuterol (PROVENTIL HFA;VENTOLIN HFA) 108 (90 BASE) MCG/ACT inhaler Inhale 1-2 puffs into the lungs every 6 (six) hours as needed for wheezing or shortness of breath. 1 Inhaler 0   escitalopram (LEXAPRO) 10 MG tablet Take 1.5 tablets (15 mg total) by mouth daily. 45 tablet 1   letrozole (FEMARA) 2.5 MG tablet 7.5mg (3 tabs) daily on days 5-9 of cycle. 15 tablet 0   medroxyPROGESTERone (PROVERA) 10 MG tablet Take 1 tablet (10 mg total) by mouth daily. 10 tablet 0   metFORMIN (GLUCOPHAGE) 500 MG tablet TAKE 2 TABLETS BY MOUTH TWICE A DAY 360 tablet 0   No current facility-administered medications for this visit.      Psychiatric Specialty Exam: Review of Systems  Cardiovascular:  Negative for chest pain.  Neurological:  Negative for tremors.  Psychiatric/Behavioral:   Positive for agitation and dysphoric mood.     There were no vitals taken for this visit.There is no height or weight on file to calculate BMI.  General Appearance: casual  Eye Contact: fair  Speech:  Slow  Volume:  Decreased  Mood: depressed  Affect:  Congruent  Thought Process:  Goal Directed  Orientation:  Full (Time, Place, and Person)  Thought Content:  Rumination  Suicidal Thoughts:  No  Homicidal Thoughts:  No  Memory:  Immediate;   Fair Recent;   Fair  Judgement:  Fair  Insight:  Shallow  Psychomotor Activity:  Normal  Concentration:  Concentration: Fair and Attention Span: Fair  Recall:  [employer] of Knowledge:Good  Language: Good  Akathisia:  No  Handed:  AIMS (if indicated):  not done  Assets:  Desire for Improvement Financial Resources/Insurance Physical Health Social Support  ADL's:  Intact  Cognition: WNL  Sleep:  Fair   Screenings: GAD-7    Flowsheet Row Video Visit from 08/09/2021 in [facility]  Total GAD-7 Score 13      Flowsheet Row Video Visit from 11/17/2021 in [facility] at [facility] Video Visit from 09/08/2021 in [facility] at [facility] Video Visit from 02/03/2021 in [facility] at [facility]  C-SSRS RISK CATEGORY Error: Question 2 not populated No Risk No Risk       Assessment and Plan: as follows  Prior documentation reviewed   MDD moderate: not compliant with meds and stopped, now back on . Pregnancy 6 months. Can increase lexapro to 15mg  is responding  Risk understands  Follow closely with OB in regard to prenatal care and after  GAD with panic: stressed, gets edgy, increase lexapro to 15mg . Consider therapy, she will call to schedule  Social anxiety : did not endorse concerns as her focus I more so depression, will increase lexapro and recommend therapy  Reviewed meds, hold off vistaril Call  earlier if needed otherwise in one month      Thresa Ross, MD 3/24/20253:34 PM

## 2023-05-12 ENCOUNTER — Encounter (HOSPITAL_COMMUNITY): Payer: Self-pay | Admitting: Psychiatry

## 2023-05-12 ENCOUNTER — Telehealth (HOSPITAL_COMMUNITY): Admitting: Psychiatry

## 2023-05-12 DIAGNOSIS — F331 Major depressive disorder, recurrent, moderate: Secondary | ICD-10-CM | POA: Diagnosis not present

## 2023-05-12 DIAGNOSIS — F411 Generalized anxiety disorder: Secondary | ICD-10-CM | POA: Diagnosis not present

## 2023-05-12 DIAGNOSIS — F4010 Social phobia, unspecified: Secondary | ICD-10-CM | POA: Diagnosis not present

## 2023-05-12 NOTE — Progress Notes (Signed)
 [facility] Follow up visit  Patient Identification: Brenda Bauer MRN:  [id number] Date of Evaluation:  05/12/2023 Referral Source: OB/ Provider Chief Complaint:  follow up depression  Visit Diagnosis:    ICD-10-CM   1. MDD (major depressive disorder), recurrent episode, moderate (HCC)  F33.1     2. GAD (generalized anxiety disorder)  F41.1     3. Social anxiety disorder  F40.10     Virtual Visit via Video Note  I connected with Brenda Bauer on 05/12/23 at  1:00 PM EDT by a video enabled telemedicine application and verified that I am speaking with the correct person using two identifiers.  Location: Patient: parked car Provider: home office   I discussed the limitations of evaluation and management by telemedicine and the availability of in person appointments. The patient expressed understanding and agreed to proceed.      I discussed the assessment and treatment plan with the patient. The patient was provided an opportunity to ask questions and all were answered. The patient agreed with the plan and demonstrated an understanding of the instructions.   The patient was advised to call back or seek an in-person evaluation if the symptoms worsen or if the condition fails to improve as anticipated.  I provided 16 minutes of non-face-to-face time during this encounter.         History of Present Illness: Patient is a 33 years old currently married female initially referred by provider for management of anxiety. 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 That has helped depression, less edgy as well  Stress of work but has good family support , gets fatigue due to pregnancy   Aggravating factor: dificult childhood with dad Modifying factor: husband,  animals Duration since 2017  No suicidal toughts or admission  Severity better     Past  Psychiatric History: anxiety    Past Medical History:  Past Medical History:  Diagnosis Date   Anxiety    Asthma    Asthma     Past Surgical History:  Procedure Laterality Date   WISDOM TOOTH EXTRACTION      Family Psychiatric History: dad : depression , anxieyt  Family History:  Family History  Problem Relation Age of Onset   Diabetes Father    Heart disease Father    Cancer Paternal Grandfather     Social History:   Social History   Socioeconomic History   Marital status: Married    Spouse name: Not on file   Number of children: Not on file   Years of education: Not on file   Highest education level: Not on file  Occupational History   Not on file  Tobacco Use   Smoking status: Former    Types: Cigarettes   Smokeless tobacco: Never  Substance and Sexual Activity   Alcohol use: No   Drug use: No   Sexual activity: Yes    Partners: Male    Birth control/protection: Pill, None  Other Topics Concern   Not on file  Social History Narrative   Not on file   Social Drivers of Health   Financial Resource Strain: Medium Risk (02/27/2023)   Received from [facility]   Overall Financial Resource Strain (CARDIA)    Difficulty of Paying Living Expenses: Somewhat hard  Food Insecurity: Food Insecurity Present (02/27/2023)   [facility]   Hunger Vital Sign    Worried About Running Out of Food in the Last Year: Sometimes true    Ran Out of Food in the Last Year: Sometimes true  Transportation Needs: No Transportation Needs (02/27/2023)   Received from [facility]   PRAPARE - Transportation    Lack of Transportation (Medical): No    Lack of Transportation (Non-Medical): No  Physical Activity: Sufficiently Active (02/27/2023)   [facility]   Exercise Vital Sign    Days of Exercise per Week: 7 days    Minutes of Exercise per Session: 30 min  Stress: No Stress Concern Present (02/27/2023)   Received from [facility] of Occupational Health - Occupational Stress Questionnaire    Feeling of Stress : Only a little  Social Connections: Socially Integrated (02/27/2023)   Received from [facility]   Social Network    How would you rate your social network (family, work, friends)?: Good participation with social networks      Allergies:  No Known Allergies  Metabolic Disorder Labs: Lab Results  Component Value Date   HGBA1C 5.7 (H) 01/26/2018   Lab Results  Component Value Date   PROLACTIN 8.4 01/26/2018   No results found for: "CHOL", "TRIG", "HDL", "CHOLHDL", "VLDL", "LDLCALC" Lab Results  Component Value Date   TSH 1.450 01/26/2018    Therapeutic Level Labs: No results found for: "LITHIUM" No results found for: "CBMZ" No results found for: "VALPROATE"  Current Medications: Current Outpatient Medications  Medication Sig Dispense Refill   albuterol  (PROVENTIL  HFA;VENTOLIN  HFA) 108 (90 BASE) MCG/ACT inhaler Inhale 1-2 puffs into the lungs every 6 (six) hours as needed for wheezing or shortness of breath. 1 Inhaler 0   escitalopram  (LEXAPRO ) 10 MG tablet Take 1.5 tablets (15 mg total) by mouth daily. 45 tablet 1   letrozole  (FEMARA ) 2.5 MG tablet 7.5mg (3 tabs) daily on days 5-9 of cycle. 15 tablet 0   medroxyPROGESTERone  (PROVERA ) 10 MG tablet Take 1 tablet (10 mg total) by mouth daily. 10 tablet 0   metFORMIN  (GLUCOPHAGE ) 500 MG tablet TAKE 2 TABLETS BY MOUTH TWICE A DAY 360 tablet 0   No current facility-administered medications for this visit.      Psychiatric Specialty Exam: Review of Systems  Cardiovascular:  Negative for chest pain.  Neurological:  Negative for tremors.  Psychiatric/Behavioral:  Positive for dysphoric mood.     There were no vitals taken for this visit.There is no height or weight on file to calculate BMI.  General Appearance: casual  Eye Contact: fair  Speech:  Slow  Volume:  Decreased  Mood: better  Affect:  Congruent  Thought Process:  Goal  Directed  Orientation:  Full (Time, Place, and Person)  Thought Content:  Rumination  Suicidal Thoughts:  No  Homicidal Thoughts:  No  Memory:  Immediate;   Fair Recent;   Fair  Judgement:  Fair  Insight:  Shallow  Psychomotor Activity:  Normal  Concentration:  Concentration: Fair and Attention Span: Fair  Recall:  [employer] of Knowledge:Good  Language: Good  Akathisia:  No  Handed:   AIMS (if indicated):  not done  Assets:  Desire for Improvement Financial Resources/Insurance Physical Health Social Support  ADL's:  Intact  Cognition: WNL  Sleep:  Fair   Screenings: GAD-7    Flowsheet Row Video Visit from 08/09/2021 in [facility]  Total GAD-7 Score 13      Flowsheet Row Video  Visit from 04/12/2023 in [facility] Behavioral [facility] at [facility] Video Visit from 11/17/2021 in [facility] at [facility] Video Visit from 09/08/2021 in [facility] Behavior[facility] at [facility]  C-SSRS RISK CATEGORY No Risk Error: Question 2 not populated No Risk       Assessment and Plan: as follows  Prior documentation reviewed   MDD moderate: improved. Less edgy, continue lexapro  15mg . Aware of risk and concerns  Follow closely with OB in regard to prenatal care and after  GAD with panic: manageable continue lexapro . Vistaril  is on hold Add activiites to help with stress. Has good support at home  Follow with OB and aware of meds risk and concerns  Social anxiety :   FU 2 -3 months or within a month post partum, call earlier if needed     Wray Heady, MD 4/23/20251:07 PM

## 2023-06-01 ENCOUNTER — Other Ambulatory Visit (HOSPITAL_COMMUNITY): Payer: Self-pay | Admitting: Psychiatry

## 2023-06-20 ENCOUNTER — Other Ambulatory Visit (HOSPITAL_COMMUNITY): Payer: Self-pay | Admitting: Psychiatry

## 2023-07-12 ENCOUNTER — Other Ambulatory Visit (HOSPITAL_COMMUNITY): Payer: Self-pay | Admitting: Psychiatry

## 2023-08-16 ENCOUNTER — Encounter (HOSPITAL_COMMUNITY): Payer: Self-pay

## 2023-08-16 ENCOUNTER — Telehealth (HOSPITAL_COMMUNITY): Admitting: Psychiatry

## 2023-08-23 ENCOUNTER — Telehealth (HOSPITAL_COMMUNITY): Admitting: Psychiatry

## 2023-08-27 ENCOUNTER — Encounter (HOSPITAL_COMMUNITY): Payer: Self-pay | Admitting: Psychiatry

## 2023-08-27 ENCOUNTER — Telehealth (HOSPITAL_COMMUNITY): Admitting: Psychiatry

## 2023-08-27 DIAGNOSIS — F410 Panic disorder [episodic paroxysmal anxiety] without agoraphobia: Secondary | ICD-10-CM | POA: Diagnosis not present

## 2023-08-27 DIAGNOSIS — F418 Other specified anxiety disorders: Secondary | ICD-10-CM

## 2023-08-27 DIAGNOSIS — F411 Generalized anxiety disorder: Secondary | ICD-10-CM | POA: Diagnosis not present

## 2023-08-27 DIAGNOSIS — F331 Major depressive disorder, recurrent, moderate: Secondary | ICD-10-CM | POA: Diagnosis not present

## 2023-08-27 MED ORDER — ESCITALOPRAM OXALATE 10 MG PO TABS: 10.0000 mg | ORAL_TABLET | Freq: Every day | ORAL | 1 refills | Status: DC

## 2023-08-27 MED ORDER — BUSPIRONE HCL 5 MG PO TABS: 5.0000 mg | ORAL_TABLET | Freq: Every day | ORAL | 0 refills | Status: AC | PRN

## 2023-08-27 NOTE — Progress Notes (Signed)
 [facility] Follow up visit  Patient Identification: Brenda Bauer MRN:  [id number] Date of Evaluation:  08/27/2023 Referral Source: OB/ Provider Chief Complaint:  follow up depression  Visit Diagnosis:    ICD-10-CM   1. MDD (major depressive disorder), recurrent episode, moderate (HCC)  F33.1     2. GAD (generalized anxiety disorder)  F41.1     3. Panic attack  F41.0     Virtual Visit via Video Note  I connected with Brenda Bauer on 08/27/23 at  9:00 AM EDT by a video enabled telemedicine application and verified that I am speaking with the correct person using two identifiers.  Location: Patient: home with husband Provider: home office   I discussed the limitations of evaluation and management by telemedicine and the availability of in person appointments. The patient expressed understanding and agreed to proceed.      I discussed the assessment and treatment plan with the patient. The patient was provided an opportunity to ask questions and all were answered. The patient agreed with the plan and demonstrated an understanding of the instructions.   The patient was advised to call back or seek an in-person evaluation if the symptoms worsen or if the condition fails to improve as anticipated.  I provided 18 minutes of non-face-to-face time during this encounter.      History of Present Illness: Patient is a 33 years old currently married female initially referred by provider for management of anxiety. She worked as a Midwife.  Patient is 4 weeks postpartum and delivery went well baby bonding is well I saw the baby was smiling.  She is breast-feeding through the bottle.  Baby is easy temperament.  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   Husband is supportive     Aggravating factor: dificult childhood with dad Modifying factor: husband, animals, new baby Duration since 2017  No suicidal toughts or admission  Severity stable postpartum     Past Psychiatric History: anxiety    Past Medical History:  Past Medical History:  Diagnosis Date   Anxiety    Asthma    Asthma     Past Surgical History:  Procedure Laterality Date   WISDOM TOOTH EXTRACTION      Family Psychiatric History: dad : depression , anxieyt  Family History:  Family History  Problem Relation Age of Onset   Diabetes Father    Heart disease Father    Cancer Paternal Grandfather     Social History:   Social History   Socioeconomic History   Marital status: Married    Spouse name: Not on file   Number of children: Not on file   Years of education: Not on file   Highest education level: Not on file  Occupational History   Not on file  Tobacco Use   Smoking status: Former    Types: Cigarettes   Smokeless tobacco: Never  Substance and Sexual Activity   Alcohol use: No   Drug use: No   Sexual activity: Yes    Partners: Male    Birth control/protection: Pill, None  Other Topics Concern   Not on file  Social History Narrative   Not on file   Social Drivers of Health  Financial Resource Strain: Medium Risk (07/10/2023)   Received from [facility]   Overall Financial Resource Strain (CARDIA)    Difficulty of Paying Living Expenses: Somewhat hard  Food Insecurity: No Food Insecurity (07/10/2023)   Received from [facility]   Hunger Vital Sign    Within the past 12 months, you worried that your food would run out before you got the money to buy more.: Never true    Within the past 12 months, the food you bought just didn't last and you didn't have money to get more.: Never true  Recent Concern: Food Insecurity - Food Insecurity Present (06/08/2023)   Received from [facility]   Hunger Vital Sign    Within the past 12  months, you worried that your food would run out before you got the money to buy more.: Sometimes true    Within the past 12 months, the food you bought just didn't last and you didn't have money to get more.: Never true  Transportation Needs: No Transportation Needs (07/10/2023)   Received from [facility] - Transportation    Lack of Transportation (Medical): No    Lack of Transportation (Non-Medical): No  Physical Activity: Sufficiently Active (06/08/2023)   Received from [facility]   Exercise Vital Sign    On average, how many days per week do you engage in moderate to strenuous exercise (like a brisk walk)?: 4 days    On average, how many minutes do you engage in exercise at this level?: 150+ min  Stress: Stress Concern Present (07/10/2023)   Received from [facility] - Occupational Stress Questionnaire    Feeling of Stress : Rather much  Social Connections: Socially Integrated (06/08/2023)   Received from [facility]   Social Network    How would you rate your social network (family, work, friends)?: Good participation with social networks      Allergies:  No Known Allergies  Metabolic Disorder Labs: Lab Results  Component Value Date   HGBA1C 5.7 (H) 01/26/2018   Lab Results  Component Value Date   PROLACTIN 8.4 01/26/2018   No results found for: CHOL, TRIG, HDL, CHOLHDL, VLDL, LDLCALC Lab Results  Component Value Date   TSH 1.450 01/26/2018    Therapeutic Level Labs: No results found for: LITHIUM No results found for: CBMZ No results found for: VALPROATE  Current Medications: Current Outpatient Medications  Medication Sig Dispense Refill   busPIRone  (BUSPAR ) 5 MG tablet Take 1 tablet (5 mg total) by mouth daily as needed. 30 tablet 0   albuterol  (PROVENTIL  HFA;VENTOLIN  HFA) 108 (90 BASE) MCG/ACT inhaler Inhale 1-2 puffs into the lungs every 6 (six) hours as needed for wheezing or  shortness of breath. 1 Inhaler 0   escitalopram  (LEXAPRO ) 10 MG tablet Take 1 tablet (10 mg total) by mouth daily. 30 tablet 1   letrozole  (FEMARA ) 2.5 MG tablet 7.5mg (3 tabs) daily on days 5-9 of cycle. 15 tablet 0   medroxyPROGESTERone  (PROVERA ) 10 MG tablet Take 1 tablet (10 mg total) by mouth daily. 10 tablet 0   metFORMIN  (GLUCOPHAGE ) 500 MG tablet TAKE 2 TABLETS BY MOUTH TWICE A DAY 360 tablet 0   No current facility-administered medications for this visit.      Psychiatric Specialty Exam: Review of Systems  Cardiovascular:  Negative for chest pain.  Neurological:  Negative for tremors.    There were no vitals taken for this visit.There is no height or  weight on file to calculate BMI.  General Appearance: casual  Eye Contact: fair  Speech:  Slow  Volume:  Decreased  Mood: Euthymic  Affect:  Congruent  Thought Process:  Goal Directed  Orientation:  Full (Time, Place, and Person)  Thought Content:  Rumination  Suicidal Thoughts:  No  Homicidal Thoughts:  No  Memory:  Immediate;   Fair Recent;   Fair  Judgement:  Fair  Insight:  Shallow  Psychomotor Activity:  Normal  Concentration:  Concentration: Fair and Attention Span: Fair  Recall:  [employer] of Knowledge:Good  Language: Good  Akathisia:  No  Handed:   AIMS (if indicated):  not done  Assets:  Desire for Improvement Financial Resources/Insurance Physical Health Social Support  ADL's:  Intact  Cognition: WNL  Sleep:  Fair   Screenings: GAD-7    Flowsheet Row Video Visit from 08/09/2021 in [facility]  Total GAD-7 Score 13   Flowsheet Row Video Visit from 05/12/2023 in [facility] Video Visit from 04/12/2023 in [facility] at [facility] Video Visit from 11/17/2021 in [facility] at [facility]  C-SSRS RISK CATEGORY No Risk No Risk Error: Question 2 not populated     Assessment and Plan: as follows Prior documentation reviewed   MDD moderate: Remains stable postpartum Lexapro  is 10 mg.  Bonding is going on well we will continue support system is good  GAD with panic: Manageable continue Lexapro  10 mg add small dose of BuSpar  for as needed she may not take it but she just also has some medication on board in addition to Lexapro  as needed patient also to discuss all medication with her OB    Social anxiety : Manageable.  She is back to work in a 30 hours husband is supportive continue Lexapro  Follow-up in 3 months or earlier if needed    Jackey Flight, MD 8/8/20258:56 AM

## 2023-11-26 ENCOUNTER — Other Ambulatory Visit (HOSPITAL_COMMUNITY): Payer: Self-pay | Admitting: Psychiatry

## 2023-11-26 ENCOUNTER — Encounter (HOSPITAL_COMMUNITY): Payer: Self-pay | Admitting: Psychiatry

## 2023-11-26 ENCOUNTER — Telehealth (HOSPITAL_COMMUNITY): Admitting: Psychiatry

## 2023-11-26 DIAGNOSIS — F411 Generalized anxiety disorder: Secondary | ICD-10-CM

## 2023-11-26 DIAGNOSIS — F4010 Social phobia, unspecified: Secondary | ICD-10-CM

## 2023-11-26 DIAGNOSIS — F331 Major depressive disorder, recurrent, moderate: Secondary | ICD-10-CM

## 2023-11-26 DIAGNOSIS — F410 Panic disorder [episodic paroxysmal anxiety] without agoraphobia: Secondary | ICD-10-CM | POA: Diagnosis not present

## 2023-11-26 MED ORDER — ESCITALOPRAM OXALATE 10 MG PO TABS: 10.0000 mg | ORAL_TABLET | Freq: Every day | ORAL | 1 refills | Status: DC

## 2023-11-26 MED ORDER — HYDROXYZINE HCL 10 MG PO TABS: 10.0000 mg | ORAL_TABLET | Freq: Every day | ORAL | 0 refills | Status: DC | PRN

## 2023-11-26 NOTE — Progress Notes (Signed)
 [facility] Follow up visit  Patient Identification: Brenda Bauer MRN:  [id number] Date of Evaluation:  11/26/2023 Referral Source: OB/ Provider Chief Complaint:  follow up depression  Visit Diagnosis:    ICD-10-CM   1. MDD (major depressive disorder), recurrent episode, moderate (HCC)  F33.1     2. GAD (generalized anxiety disorder)  F41.1     3. Panic attack  F41.0     Virtual Visit via Video Note  I connected with Brenda Bauer on 11/26/23 at  9:00 AM EST by a video enabled telemedicine application and verified that I am speaking with the correct person using two identifiers.  Location: Patient: home Provider: home office   I discussed the limitations of evaluation and management by telemedicine and the availability of in person appointments. The patient expressed understanding and agreed to proceed.     I discussed the assessment and treatment plan with the patient. The patient was provided an opportunity to ask questions and all were answered. The patient agreed with the plan and demonstrated an understanding of the instructions.   The patient was advised to call back or seek an in-person evaluation if the symptoms worsen or if the condition fails to improve as anticipated.  I provided 18 minutes of non-face-to-face time during this encounter.     History of Present Illness: Patient is a 33 years old currently married female initially referred by provider for management of anxiety. She worked as a Midwife.  On evaluation patient is now postpartum 4 months bonding is reasonably well.  She is now breast-feeding.  She is responding to Lexapro  and regarding to her anxiety but still has moments or anxiety when she is at work and panic-like symptoms states BuSpar  does not help and she wants to change the medication  Otherwise husband is supportive    Aggravating factor: dificult childhood with dad Modifying factor: husband, animals, new baby Duration since 2017  No  suicidal toughts or admission  Severity depression stable has anxiety at work   Past Psychiatric History: anxiety    Past Medical History:  Past Medical History:  Diagnosis Date   Anxiety    Asthma    Asthma     Past Surgical History:  Procedure Laterality Date   WISDOM TOOTH EXTRACTION      Family Psychiatric History: dad : depression , anxieyt  Family History:  Family History  Problem Relation Age of Onset   Diabetes Father    Heart disease Father    Cancer Paternal Grandfather     Social History:   Social History   Socioeconomic History   Marital status: Married    Spouse name: Not on file   Number of children: Not on file   Years of education: Not on file   Highest education level: Not on file  Occupational History   Not on file  Tobacco Use   Smoking status: Former    Types: Cigarettes   Smokeless tobacco: Never  Substance and Sexual Activity   Alcohol use: No   Drug use: No   Sexual activity: Yes    Partners: Male    Birth control/protection: Pill, None  Other Topics Concern   Not on file  Social History Narrative   Not on file   Social Drivers of Health   Financial Resource Strain: Low Risk  (10/26/2023)   Received from [facility]   Overall Financial Resource Strain (CARDIA)    How hard is it for you to pay  for the very basics like food, housing, medical care, and heating?: Not hard at all  Food Insecurity: No Food Insecurity (10/26/2023)   Received from [facility]   Hunger Vital Sign    Within the past 12 months, you worried that your food would run out before you got the money to buy more.: Never true    Within the past 12 months, the food you bought just didn't last and you didn't have money to get more.: Never true  Transportation Needs: No Transportation Needs (10/26/2023)   Received from [facility] - Transportation    In the past 12 months, has lack of transportation kept you from medical appointments or from getting  medications?: No    In the past 12 months, has lack of transportation kept you from meetings, work, or from getting things needed for daily living?: No  Physical Activity: Sufficiently Active (10/26/2023)   Received from [facility]   Exercise Vital Sign    On average, how many days per week do you engage in moderate to strenuous exercise (like a brisk walk)?: 7 days    On average, how many minutes do you engage in exercise at this level?: 30 min  Stress: Stress Concern Present (10/26/2023)   Received from [facility] - Occupational Stress Questionnaire    Do you feel stress - tense, restless, nervous, or anxious, or unable to sleep at night because your mind is troubled all the time - these days?: To some extent  Social Connections: Socially Integrated (10/26/2023)   Received from [facility]   Social Network    How would you rate your social network (family, work, friends)?: Good participation with social networks      Allergies:  No Known Allergies  Metabolic Disorder Labs: Lab Results  Component Value Date   HGBA1C 5.7 (H) 01/26/2018   Lab Results  Component Value Date   PROLACTIN 8.4 01/26/2018   No results found for: CHOL, TRIG, HDL, CHOLHDL, VLDL, LDLCALC Lab Results  Component Value Date   TSH 1.450 01/26/2018    Therapeutic Level Labs: No results found for: LITHIUM No results found for: CBMZ No results found for: VALPROATE  Current Medications: Current Outpatient Medications  Medication Sig Dispense Refill   hydrOXYzine  (ATARAX ) 10 MG tablet Take 1 tablet (10 mg total) by mouth daily as needed. 30 tablet 0   albuterol  (PROVENTIL  HFA;VENTOLIN  HFA) 108 (90 BASE) MCG/ACT inhaler Inhale 1-2 puffs into the lungs every 6 (six) hours as needed for wheezing or shortness of breath. 1 Inhaler 0   busPIRone  (BUSPAR ) 5 MG tablet Take 1 tablet (5 mg total) by mouth daily as needed. 30 tablet 0   escitalopram   (LEXAPRO ) 10 MG tablet Take 1 tablet by mouth once daily 30 tablet 0   letrozole  (FEMARA ) 2.5 MG tablet 7.5mg (3 tabs) daily on days 5-9 of cycle. 15 tablet 0   medroxyPROGESTERone  (PROVERA ) 10 MG tablet Take 1 tablet (10 mg total) by mouth daily. 10 tablet 0   metFORMIN  (GLUCOPHAGE ) 500 MG tablet TAKE 2 TABLETS BY MOUTH TWICE A DAY 360 tablet 0   No current facility-administered medications for this visit.      Psychiatric Specialty Exam: Review of Systems  Cardiovascular:  Negative for chest pain.  Neurological:  Negative for tremors.    There were no vitals taken for this visit.There is no height or weight on file to calculate BMI.  General Appearance: casual  Eye Contact: fair  Speech:  Slow  Volume:  Decreased  Mood: Somewhat anxious  Affect:  Congruent  Thought Process:  Goal Directed  Orientation:  Full (Time, Place, and Person)  Thought Content:  Rumination  Suicidal Thoughts:  No  Homicidal Thoughts:  No  Memory:  Immediate;   Fair Recent;   Fair  Judgement:  Fair  Insight:  Shallow  Psychomotor Activity:  Normal  Concentration:  Concentration: Fair and Attention Span: Fair  Recall:  [employer] of Knowledge:Good  Language: Good  Akathisia:  No  Handed:   AIMS (if indicated):  not done  Assets:  Desire for Improvement Financial Resources/Insurance Physical Health Social Support  ADL's:  Intact  Cognition: WNL  Sleep:  Fair   Screenings: GAD-7    Flowsheet Row Video Visit from 08/09/2021 in [facility]  Total GAD-7 Score 13   Flowsheet Row Video Visit from 11/26/2023 in [facility] Video Visit from 05/12/2023 in [facility]al Health at [facility] Video Visit from 04/12/2023 in [facility] at [facility]  C-SSRS RISK CATEGORY No Risk No Risk No Risk    Assessment and Plan: as follows Prior documentation reviewed   MDD  moderate: Remains stable postpartum continue Lexapro  bonding is well   GAD with panic: Has join work but gets anxious and panicky at times will start hydroxyzine  that has helped in the past discontinue BuSpar  continue Lexapro  she does not want to increase the Lexapro   Social anxiety : Manageable continue Lexapro  husband is supportive add hydroxyzine  for added anxiety  Reviewed medication questions addressed refill sent if due  follow-up in 3 months or earlier if needed    Jackey Flight, MD 11/7/20259:21 AM

## 2023-12-28 ENCOUNTER — Other Ambulatory Visit (HOSPITAL_COMMUNITY): Payer: Self-pay | Admitting: Psychiatry

## 2024-02-25 ENCOUNTER — Encounter (HOSPITAL_COMMUNITY): Payer: Self-pay | Admitting: Psychiatry

## 2024-02-25 ENCOUNTER — Telehealth (HOSPITAL_COMMUNITY): Admitting: Psychiatry

## 2024-02-25 DIAGNOSIS — F331 Major depressive disorder, recurrent, moderate: Secondary | ICD-10-CM

## 2024-02-25 DIAGNOSIS — F411 Generalized anxiety disorder: Secondary | ICD-10-CM

## 2024-02-25 DIAGNOSIS — F410 Panic disorder [episodic paroxysmal anxiety] without agoraphobia: Secondary | ICD-10-CM

## 2024-02-25 DIAGNOSIS — F4010 Social phobia, unspecified: Secondary | ICD-10-CM

## 2024-02-25 NOTE — Progress Notes (Signed)
 " [facility] Follow up visit  Patient Identification: Brenda Bauer MRN:  [id number] Date of Evaluation:  02/25/2024 Referral Source: OB/ Provider Chief Complaint:  follow up depression  Visit Diagnosis:    ICD-10-CM   1. MDD (major depressive disorder), recurrent episode, moderate (HCC)  F33.1     2. GAD (generalized anxiety disorder)  F41.1     3. Panic attack  F41.0     4. Social anxiety disorder  F40.10      Virtual Visit via Video Note  I connected with Brenda Bauer on 02/25/24 at 10:30 AM EST by a video enabled telemedicine application and verified that I am speaking with the correct person using two identifiers.  Location: Patient: home Provider: home office   I discussed the limitations of evaluation and management by telemedicine and the availability of in person appointments. The patient expressed understanding and agreed to proceed.     I discussed the assessment and treatment plan with the patient. The patient was provided an opportunity to ask questions and all were answered. The patient agreed with the plan and demonstrated an understanding of the instructions.   The patient was advised to call back or seek an in-person evaluation if the symptoms worsen or if the condition fails to improve as anticipated.  I provided 20 minutes of non-face-to-face time during this encounter.    History of Present Illness: Patient is a 34 years old currently married female initially referred by provider for management of anxiety. She worked as a Midwife.  On evaluation patient is doing stable she is postpartum nearly 7 months today 1 is going on well she is not breast-feeding she is responding to Lexapro  and regarding her depression  They are mostly get stressed out or panicky and at that times hydroxyzine  does not help and later on she falls asleep while sedated  Relationship is going on well depression is manageable     Aggravating factor: Difficult childhood with  dad Modifying factor: husband, animals, new baby Duration since 2017  No suicidal toughts or admission  Severity depression stable at times she does get panicky Past Psychiatric History: anxiety    Past Medical History:  Past Medical History:  Diagnosis Date   Anxiety    Asthma    Asthma     Past Surgical History:  Procedure Laterality Date   WISDOM TOOTH EXTRACTION      Family Psychiatric History: dad : depression , anxieyt  Family History:  Family History  Problem Relation Age of Onset   Diabetes Father    Heart disease Father    Cancer Paternal Grandfather     Social History:   Social History   Socioeconomic History   Marital status: Married    Spouse name: Not on file   Number of children: Not on file   Years of education: Not on file   Highest education level: Not on file  Occupational History   Not on file  Tobacco Use   Smoking status: Former    Types: Cigarettes   Smokeless tobacco: Never  Substance and Sexual Activity   Alcohol use: No   Drug use: No   Sexual activity: Yes    Partners: Male    Birth control/protection: Pill, None  Other Topics Concern   Not on file  Social History Narrative   Not on file   Social Drivers of Health   Tobacco Use: Medium Risk (02/25/2024)   Patient History    Smoking Tobacco  Use: Former    Smokeless Tobacco Use: Never    Passive Exposure: Not on Actuary Strain: Low Risk (02/22/2024)   Received from [facility]   Overall Financial Resource Strain (CARDIA)    How hard is it for you to pay for the very basics like food, housing, medical care, and heating?: Not hard at all  Food Insecurity: No Food Insecurity (02/22/2024)   Received from [facility]   Epic    Within the past 12 months, you worried that your food would run out before you got the money to buy more.: Never true    Within the past 12 months, the food you bought just didn't last and you didn't have money to get more.: Never true   Transportation Needs: No Transportation Needs (02/22/2024)   Received from [facility]    In the past 12 months, has lack of transportation kept you from medical appointments or from getting medications?: No    In the past 12 months, has lack of transportation kept you from meetings, work, or from getting things needed for daily living?: No  Physical Activity: Inactive (02/22/2024)   Received from [facility]   Exercise Vital Sign    On average, how many days per week do you engage in moderate to strenuous exercise (like a brisk walk)?: 0 days    Minutes of Exercise per Session: Not on file  Stress: No Stress Concern Present (02/22/2024)   Received from [facility] - Occupational Stress Questionnaire    Do you feel stress - tense, restless, nervous, or anxious, or unable to sleep at night because your mind is troubled all the time - these days?: Only a little  Social Connections: Socially Integrated (02/22/2024)   Received from [facility]   Social Network    How would you rate your social network (family, work, friends)?: Good participation with social networks  Depression (PHQ2-9): Not on file  Alcohol Screen: Not on file  Housing: Low Risk (02/22/2024)   [facility]    In the last 12 months, was there a time when you were not able to pay the mortgage or rent on time?: No    In the past 12 months, how many times have you moved where you were living?: 0    At any time in the past 12 months, were you homeless or living in a shelter (including now)?: No  Utilities: Not At Risk (02/22/2024)   Received from [facility]    In the past 12 months has the electric, gas, oil, or water company threatened to shut off services in your home?: No  Health Literacy: Not on file      Allergies:  No Known Allergies  Metabolic Disorder Labs: Lab Results  Component Value Date   HGBA1C 5.7 (H) 01/26/2018   Lab  Results  Component Value Date   PROLACTIN 8.4 01/26/2018   No results found for: CHOL, TRIG, HDL, CHOLHDL, VLDL, LDLCALC Lab Results  Component Value Date   TSH 1.450 01/26/2018    Therapeutic Level Labs: No results found for: LITHIUM No results found for: CBMZ No results found for: VALPROATE  Current Medications: Current Outpatient Medications  Medication Sig Dispense Refill   albuterol  (PROVENTIL  HFA;VENTOLIN  HFA) 108 (90 BASE) MCG/ACT inhaler Inhale 1-2 puffs into the lungs every 6 (six) hours as needed for wheezing or shortness of breath.  1 Inhaler 0   busPIRone  (BUSPAR ) 5 MG tablet Take 1 tablet (5 mg total) by mouth daily as needed. 30 tablet 0   escitalopram  (LEXAPRO ) 10 MG tablet Take 1 tablet by mouth once daily 30 tablet 0   hydrOXYzine  (ATARAX ) 10 MG tablet TAKE 1 TABLET BY MOUTH ONCE DAILY AS NEEDED 30 tablet 0   letrozole  (FEMARA ) 2.5 MG tablet 7.5mg (3 tabs) daily on days 5-9 of cycle. 15 tablet 0   medroxyPROGESTERone  (PROVERA ) 10 MG tablet Take 1 tablet (10 mg total) by mouth daily. 10 tablet 0   metFORMIN  (GLUCOPHAGE ) 500 MG tablet TAKE 2 TABLETS BY MOUTH TWICE A DAY 360 tablet 0   No current facility-administered medications for this visit.      Psychiatric Specialty Exam: Review of Systems  Cardiovascular:  Negative for chest pain.  Neurological:  Negative for tremors.    There were no vitals taken for this visit.There is no height or weight on file to calculate BMI.  General Appearance: casual  Eye Contact: fair  Speech:  Slow  Volume:  Decreased  Mood: Somewhat anxious  Affect:  Congruent  Thought Process:  Goal Directed  Orientation:  Full (Time, Place, and Person)  Thought Content:  Rumination  Suicidal Thoughts:  No  Homicidal Thoughts:  No  Memory:  Immediate;   Fair Recent;   Fair  Judgement:  Fair  Insight:  Shallow  Psychomotor Activity:  Normal  Concentration:  Concentration: Fair and Attention Span: Fair  Recall:   [employer] of Knowledge:Good  Language: Good  Akathisia:  No  Handed:   AIMS (if indicated):  not done  Assets:  Desire for Improvement Financial Resources/Insurance Physical Health Social Support  ADL's:  Intact  Cognition: WNL  Sleep:  Fair   Screenings: GAD-7    Flowsheet Row Video Visit from 08/09/2021 in [facility]  Total GAD-7 Score 13   Flowsheet Row Video Visit from 11/26/2023 in [facility] Video Visit from 05/12/2023 in [facility] Video Visit from 04/12/2023 in [facility]  C-SSRS RISK CATEGORY No Risk No Risk No Risk    Assessment and Plan: as follows Prior documentation reviewed   MDD moderate: Remains stable continue Lexapro  10 mg daily bonding is going on well GAD with panic: She does get stressed out in general anxiety is manageable she gets panicky at times he wants to consider another medication rather than hydroxyzine  she states we can try BuSpar  again that has helped continue hydroxyzine  10 mg as needed as well also understand that Lexapro  does help with the panic and anxiety symptoms   social anxiety : Manageable continue Lexapro  and BuSpar   Medication reviewed questions addressed follow-up in 2 to 3 months or earlier if needed refill sent if due  Jackey Flight, MD 2/6/202610:36 AM "

## 2024-05-26 ENCOUNTER — Telehealth (HOSPITAL_COMMUNITY): Admitting: Psychiatry
